# Patient Record
Sex: Male | Born: 1970 | Race: White | Hispanic: No | Marital: Single | Smoking: Never smoker
Health system: Southern US, Community
[De-identification: ages and names within clinical notes are randomized; demographics above are authoritative.]

## PROBLEM LIST (undated history)

## (undated) DIAGNOSIS — N200 Calculus of kidney: Secondary | ICD-10-CM

---

## 2007-07-22 ENCOUNTER — Emergency Department: Payer: Self-pay

## 2007-07-29 ENCOUNTER — Ambulatory Visit: Payer: Self-pay | Admitting: Urology

## 2009-09-04 ENCOUNTER — Ambulatory Visit: Payer: Self-pay | Admitting: Family Medicine

## 2010-03-14 ENCOUNTER — Ambulatory Visit: Payer: Self-pay | Admitting: Internal Medicine

## 2012-02-02 ENCOUNTER — Ambulatory Visit: Payer: Self-pay | Admitting: Internal Medicine

## 2015-10-02 ENCOUNTER — Ambulatory Visit: Payer: Self-pay

## 2015-10-02 ENCOUNTER — Ambulatory Visit (INDEPENDENT_AMBULATORY_CARE_PROVIDER_SITE_OTHER): Payer: 59

## 2015-10-02 ENCOUNTER — Ambulatory Visit
Admission: EM | Admit: 2015-10-02 | Discharge: 2015-10-02 | Disposition: A | Discharge status: Home / Self Care | Payer: 59 | Attending: Internal Medicine | Admitting: Internal Medicine

## 2015-10-02 ENCOUNTER — Encounter: Payer: Self-pay | Admitting: Gynecology

## 2015-10-02 DIAGNOSIS — N200 Calculus of kidney: Secondary | ICD-10-CM | POA: Diagnosis not present

## 2015-10-02 DIAGNOSIS — N1330 Unspecified hydronephrosis: Secondary | ICD-10-CM | POA: Diagnosis not present

## 2015-10-02 DIAGNOSIS — R1012 Left upper quadrant pain: Secondary | ICD-10-CM | POA: Diagnosis not present

## 2015-10-02 DIAGNOSIS — R109 Unspecified abdominal pain: Secondary | ICD-10-CM

## 2015-10-02 HISTORY — DX: Calculus of kidney: N20.0

## 2015-10-02 LAB — URINALYSIS COMPLETE WITH MICROSCOPIC (ARMC ONLY)
Bacteria, UA: NONE SEEN — AB
Glucose, UA: NEGATIVE mg/dL
LEUKOCYTES UA: NEGATIVE
NITRITE: NEGATIVE
PROTEIN: NEGATIVE mg/dL
SPECIFIC GRAVITY, URINE: 1.015 (ref 1.005–1.030)
Squamous Epithelial / LPF: NONE SEEN — AB
pH: 6.5 (ref 5.0–8.0)

## 2015-10-02 MED ORDER — ONDANSETRON HCL 4 MG PO TABS: 8.0000 mg | ORAL_TABLET | ORAL | Status: DC | PRN

## 2015-10-02 MED ORDER — KETOROLAC TROMETHAMINE 10 MG PO TABS: 10.0000 mg | ORAL_TABLET | Freq: Four times a day (QID) | ORAL | Status: DC | PRN

## 2015-10-02 MED ORDER — KETOROLAC TROMETHAMINE 60 MG/2ML IM SOLN
60.0000 mg | Freq: Once | INTRAMUSCULAR | Status: AC
  Administered 2015-10-02: 60 mg via INTRAMUSCULAR

## 2015-10-02 MED ORDER — OXYCODONE-ACETAMINOPHEN 5-325 MG PO TABS: 1.0000 | ORAL_TABLET | Freq: Four times a day (QID) | ORAL | Status: DC | PRN

## 2015-10-02 MED ORDER — SODIUM CHLORIDE 0.9 % IV BOLUS (SEPSIS)
1000.0000 mL | Freq: Once | INTRAVENOUS | Status: AC
Start: 2015-10-02 — End: 2015-10-02
  Administered 2015-10-02: 1000 mL via INTRAVENOUS

## 2015-10-02 NOTE — ED Notes (Signed)
Patient c/o lower abdomen Pain and nausea x 3 days. Per patient pain is similar to when he had his first kidney stone which he had passed..Marland Kitchen

## 2015-10-02 NOTE — ED Provider Notes (Signed)
CSN: [id number]     Arrival date & time 10/02/15  4098 History   First MD Initiated Contact with Patient 10/02/15 1119     Chief Complaint  Patient presents with  . Abdominal Pain   HPI  Patient is a 44 year old with past history of kidney stone, had one about 4 years ago the past spontaneously after about 24 hours. He presents today with onset of severe left flank pain on November 4, with vomiting and sweats at onset. Persistent nausea since, limiting fluid intake. Not able to eat anything without vomiting. Diarrhea, has not had a bowel movement. Does not think he has had a fever. Denies other past medical history, denies prior surgery.  Family history notable for diabetes, heart disease  Past Medical History  Diagnosis Date  . Kidney calculi      Social History  Substance Use Topics  . Smoking status: Never Smoker   . Smokeless tobacco: None  . Alcohol Use: No    Review of Systems  All other systems reviewed and are negative.   Allergies  Review of patient's allergies indicates no known allergies.  Home Medications   Prior to Admission medications   Medication Sig Start Date End Date Taking? Authorizing Provider  oxycodone (OXY-IR) 5 MG capsule Take 5 mg by mouth every 4 (four) hours as needed (old pill that he was previous for his kidney stones.).   Yes Historical Provider, MD                        Meds Ordered and Administered this Visit   Medications  sodium chloride 0.9 % bolus 1,000 mL (1,000 mLs Intravenous Given 10/02/15 1152) x 2 liters  ketorolac (TORADOL) injection 60 mg (60 mg Intramuscular Given 10/02/15 1204)    BP 141/84 mmHg  Pulse 86  Temp(Src) 98.3 F (36.8 C) (Tympanic)  Resp 18  Ht  (1.753 m)  Wt 285 lb (129.275 kg)  BMI 42.07 kg/m2  SpO2 99%   Physical Exam  Constitutional: He is oriented to person, place, and time.  Alert, nicely groomed Sitting in chair, holding very still because movement makes the pain worse  HENT:  Head:  Atraumatic.  Eyes:  Conjugate gaze, no eye redness/drainage  Neck: Neck supple.  Cardiovascular: Normal rate and regular rhythm.   Pulmonary/Chest: No respiratory distress. He has no wheezes. He has no rales.  Lungs clear, symmetric breath sounds  Abdominal:  Difficult abdominal exam, abdomen is large/protuberant, possibly somewhat distended. There is what appears to be voluntary type guarding. There is diffuse tenderness, with increasing severity of tenderness into the left lower quadrant. No frank rebound. Positive left CVAT  Musculoskeletal: Normal range of motion.  Neurological: He is alert and oriented to person, place, and time.  Skin: Skin is warm and dry.  No cyanosis  Nursing note and vitals reviewed.   ED Course  Procedures (including critical care time)  Labs Review  Results for orders placed or performed during the hospital encounter of 10/02/15  Urinalysis complete, with microscopic  Result Value Ref Range   Color, Urine YELLOW YELLOW   APPearance CLEAR CLEAR   Glucose, UA NEGATIVE NEGATIVE mg/dL   Bilirubin Urine 1+ (A) NEGATIVE   Ketones, ur 2+ (A) NEGATIVE mg/dL   Specific Gravity, Urine 1.015 1.005 - 1.030   Hgb urine dipstick TRACE (A) NEGATIVE   pH 6.5 5.0 - 8.0   Protein, ur NEGATIVE NEGATIVE mg/dL   Nitrite NEGATIVE NEGATIVE  Leukocytes, UA NEGATIVE NEGATIVE   RBC / HPF 0-5 <3 RBC/hpf   WBC, UA 0-5 <3 WBC/hpf   Bacteria, UA NONE SEEN (A) RARE   Squamous Epithelial / LPF NONE SEEN (A) RARE     Imaging Review Ct Renal Stone Study  10/02/2015  CLINICAL DATA:  History of kidney stones. Now with left upper quadrant and left lower quadrant pain. EXAM: CT ABDOMEN AND PELVIS WITHOUT CONTRAST TECHNIQUE: Multidetector CT imaging of the abdomen and pelvis was performed following the standard protocol without IV contrast. COMPARISON:  07/22/2007 FINDINGS: Lower chest:  No pleural effusion.  The lung bases are clear. Hepatobiliary: There are several cysts  identified within the liver. The gallbladder is normal. No biliary dilatation. Pancreas: Negative. Spleen: Normal appearance of the spleen. Adrenals/Urinary Tract: The adrenal glands are both normal. Multiple nonobstructing right renal calculi identified. The largest is in the upper pole of the right kidney measuring 4 mm, image 39 of series 6. Left-sided hydronephrosis and hydroureter is identified. At the left UPJ there is a 6 mm stone, image 50 of series 6. The urinary bladder appears normal. Stomach/Bowel: The stomach is within normal limits. The small bowel loops have a normal course and caliber. No obstruction. Normal appearance of the colon. Vascular/Lymphatic: Normal appearance of the abdominal aorta. No enlarged retroperitoneal or mesenteric adenopathy. No enlarged pelvic or inguinal lymph nodes. Reproductive: The prostate gland and seminal vesicles are unremarkable. Other: There is no ascites or focal fluid collections within the abdomen or pelvis. Musculoskeletal: No aggressive lytic or sclerotic bone lesions. IMPRESSION: 1. Left-sided hydronephrosis secondary to 6 mm UPJ calculus. 2. Bilateral nephrolithiasis. 3. Liver cysts. Electronically Signed   By: Signa Kellaylor  Stroud M.D.   On: 10/02/2015 12:53     MDM   1. Left nephrolithiasis   2. Acute left flank pain   3. Hydronephrosis of left kidney    followup with urology; call for appointment.  Discharge Medication List as of 10/02/2015  2:42 PM    START taking these medications   Details  ketorolac (TORADOL) 10 MG tablet Take 1 tablet (10 mg total) by mouth every 6 (six) hours as needed., Starting 10/02/2015, Until Discontinued, Normal    ondansetron (ZOFRAN) 4 MG tablet Take 2 tablets (8 mg total) by mouth every 4 (four) hours as needed for nausea or vomiting., Starting 10/02/2015, Until Discontinued, Normal    oxyCODONE-acetaminophen (PERCOCET/ROXICET) 5-325 MG tablet Take 1 tablet by mouth every 6 (six) hours as needed for severe pain.,  Starting 10/02/2015, Until Discontinued, Print           Eustace MooreLaura W Shaquandra Galano, MD 10/02/15 [phone]

## 2015-10-02 NOTE — Discharge Instructions (Signed)
Make appointment for followup as soon as possible with urologist (office number printed above), for left sided kidney stone causing swelling of the left kidney. Prescriptions for toradol (ketorolac, anti inflammatory and pain medicine) and zofran (ondansetron, for nausea) were sent to the pharmacy; Prescription for #15 percocet was printed. Injection of toradol 60mg  IM was given at the urgent care for pain; 2 liters of normal saline iv fluid were given for evidence of mild dehydration due to vomiting/decreased fluid intake.

## 2015-10-10 NOTE — H&P (Signed)
NAMGwenyth Thompson:  Schall, Niccolo          ACCOUNT NO.:  [id number]  MEDICAL RECORD NO.:  [id number]  LOCATION:  [facility]                        FACILITY:  [facility]  PHYSICIAN:  Anola GurneyMichael Andjela Wickes          DATE OF BIRTH:  [birth year]  DATE OF ADMISSION:  10/10/2015 DATE OF DISCHARGE:                            HISTORY AND PHYSICAL   SAME-DAY SURGERY:  October 11, 2015.  CHIEF COMPLAINT:  Left flank pain.  HISTORY OF PRESENT ILLNESS:  Mr. Dorothey BasemanStrickland is a 44 year old white male with sudden onset of left flank pain associated with nausea and vomiting on September 30, 2015.  The patient went to the emergency room on October 02, 2015, and had a CT scan done which revealed a 6 mm UPJ stone.  The stone has failed to progress and he has had persistent and severe pain. The patient comes in now for cystoscopy with left retrograde and stent placement.  PAST MEDICAL HISTORY:  No drug allergies.  CURRENT MEDICATIONS:  Oxycodone, Nucynta, Zofran.  PAST SURGICAL HISTORY:  No previous surgical procedures.  SOCIAL HISTORY:  The patient denied tobacco or alcohol use.  FAMILY HISTORY:  Remarkable for heart disease, diabetes, hypertension.  PAST AND CURRENT MEDICAL CONDITIONS:  Negative.  REVIEW OF SYSTEMS:  The patient denied chest pain, shortness of breath, diabetes, stroke, or hypertension.  PHYSICAL EXAMINATION:  GENERAL:  Well-nourished white male, in no acute distress. HEENT:  Sclerae were clear.  Pupils were equally round, reactive to light and accommodation.  Extraocular movements were intact. NECK:  Supple.  No palpable cervical adenopathy. LUNGS:  Clear to auscultation. CARDIOVASCULAR:  Regular rhythm and rate without audible murmurs. ABDOMEN:  Soft, nontender abdomen. GU AND RECTAL:  Deferred. NEUROMUSCULAR:  Alert and orient x3.  IMPRESSION:  Left ureteropelvic junction stone with renal colic.  PLAN:  Cystoscopy with left retrograde and stent placement.     ______________________________ Anola GurneyMichael Sandy Blouch     MW/MEDQ  D:  10/10/2015  T:  10/10/2015  Job:  [id number]

## 2015-10-11 ENCOUNTER — Encounter: Admission: RE | Payer: Self-pay | Source: Ambulatory Visit

## 2015-10-11 ENCOUNTER — Ambulatory Visit: Admission: RE | Admit: 2015-10-11 | Payer: Self-pay | Source: Ambulatory Visit | Admitting: Urology

## 2015-10-11 SURGERY — CYSTOSCOPY, WITH RETROGRADE PYELOGRAM
Anesthesia: Choice | Laterality: Left

## 2015-10-12 ENCOUNTER — Encounter: Payer: Self-pay | Admitting: *Deleted

## 2015-10-13 ENCOUNTER — Encounter: Payer: Self-pay | Admitting: *Deleted

## 2015-10-13 ENCOUNTER — Ambulatory Visit
Admission: RE | Admit: 2015-10-13 | Discharge: 2015-10-13 | Disposition: A | Discharge status: Home / Self Care | Payer: 59 | Source: Ambulatory Visit | Attending: Urology | Admitting: Urology

## 2015-10-13 ENCOUNTER — Encounter
Admission: RE | Disposition: A | Discharge status: Home / Self Care | Payer: Self-pay | Source: Ambulatory Visit | Attending: Urology

## 2015-10-13 DIAGNOSIS — N200 Calculus of kidney: Secondary | ICD-10-CM | POA: Insufficient documentation

## 2015-10-13 DIAGNOSIS — Z8249 Family history of ischemic heart disease and other diseases of the circulatory system: Secondary | ICD-10-CM | POA: Insufficient documentation

## 2015-10-13 DIAGNOSIS — Z833 Family history of diabetes mellitus: Secondary | ICD-10-CM | POA: Insufficient documentation

## 2015-10-13 DIAGNOSIS — Z79899 Other long term (current) drug therapy: Secondary | ICD-10-CM | POA: Diagnosis not present

## 2015-10-13 HISTORY — PX: EXTRACORPOREAL SHOCK WAVE LITHOTRIPSY: SHX1557

## 2015-10-13 SURGERY — LITHOTRIPSY, ESWL
Anesthesia: Moderate Sedation | Laterality: Left

## 2015-10-13 MED ORDER — MIDAZOLAM HCL 2 MG/2ML IJ SOLN
INTRAMUSCULAR | Status: AC
  Administered 2015-10-13: 1 mg via INTRAMUSCULAR
  Filled 2015-10-13: qty 2

## 2015-10-13 MED ORDER — DIPHENHYDRAMINE HCL 25 MG PO CAPS
25.0000 mg | ORAL_CAPSULE | ORAL | Status: AC
  Administered 2015-10-13: 25 mg via ORAL

## 2015-10-13 MED ORDER — DIPHENHYDRAMINE HCL 25 MG PO CAPS
ORAL_CAPSULE | ORAL | Status: AC
  Administered 2015-10-13: 25 mg via ORAL
  Filled 2015-10-13: qty 1

## 2015-10-13 MED ORDER — MIDAZOLAM HCL 2 MG/2ML IJ SOLN
1.0000 mg | Freq: Once | INTRAMUSCULAR | Status: AC
  Administered 2015-10-13: 1 mg via INTRAMUSCULAR

## 2015-10-13 MED ORDER — NUCYNTA 50 MG PO TABS
50.0000 mg | ORAL_TABLET | Freq: Four times a day (QID) | ORAL | Status: DC | PRN
Start: 2015-10-13 — End: 2017-02-19

## 2015-10-13 MED ORDER — LIDOCAINE HCL (PF) 1 % IJ SOLN
INTRAMUSCULAR | Status: AC
  Filled 2015-10-13: qty 2

## 2015-10-13 MED ORDER — LEVOFLOXACIN 500 MG PO TABS
ORAL_TABLET | ORAL | Status: AC
  Administered 2015-10-13: 500 mg via ORAL
  Filled 2015-10-13: qty 1

## 2015-10-13 MED ORDER — TAPENTADOL HCL 50 MG PO TABS
50.0000 mg | ORAL_TABLET | Freq: Once | ORAL | Status: AC
  Administered 2015-10-13: 50 mg via ORAL
  Filled 2015-10-13: qty 1

## 2015-10-13 MED ORDER — FUROSEMIDE 10 MG/ML IJ SOLN
10.0000 mg | Freq: Once | INTRAMUSCULAR | Status: AC
  Administered 2015-10-13: 10 mg via INTRAVENOUS

## 2015-10-13 MED ORDER — MORPHINE SULFATE (PF) 10 MG/ML IV SOLN
INTRAVENOUS | Status: AC
  Administered 2015-10-13: 10 mg via INTRAMUSCULAR
  Filled 2015-10-13: qty 1

## 2015-10-13 MED ORDER — DEXTROSE-NACL 5-0.45 % IV SOLN
INTRAVENOUS | Status: DC
  Administered 2015-10-13: 07:00:00 via INTRAVENOUS

## 2015-10-13 MED ORDER — PROMETHAZINE HCL 25 MG/ML IJ SOLN
INTRAMUSCULAR | Status: AC
  Administered 2015-10-13: 25 mg via INTRAMUSCULAR
  Filled 2015-10-13: qty 1

## 2015-10-13 MED ORDER — MORPHINE SULFATE (PF) 10 MG/ML IV SOLN
10.0000 mg | Freq: Once | INTRAVENOUS | Status: AC
  Administered 2015-10-13: 10 mg via INTRAMUSCULAR

## 2015-10-13 MED ORDER — FUROSEMIDE 10 MG/ML IJ SOLN
INTRAMUSCULAR | Status: AC
  Filled 2015-10-13: qty 2

## 2015-10-13 MED ORDER — ONDANSETRON 8 MG PO TBDP: 8.0000 mg | ORAL_TABLET | Freq: Four times a day (QID) | ORAL | Status: DC | PRN

## 2015-10-13 MED ORDER — TAMSULOSIN HCL 0.4 MG PO CAPS: 0.4000 mg | ORAL_CAPSULE | Freq: Every day | ORAL | Status: DC

## 2015-10-13 MED ORDER — DOCUSATE SODIUM 100 MG PO CAPS: 200.0000 mg | ORAL_CAPSULE | Freq: Two times a day (BID) | ORAL | Status: DC

## 2015-10-13 MED ORDER — PROMETHAZINE HCL 25 MG/ML IJ SOLN
25.0000 mg | Freq: Once | INTRAMUSCULAR | Status: AC
  Administered 2015-10-13: 25 mg via INTRAMUSCULAR

## 2015-10-13 MED ORDER — LEVOFLOXACIN 500 MG PO TABS
500.0000 mg | ORAL_TABLET | ORAL | Status: AC
  Administered 2015-10-13: 500 mg via ORAL

## 2015-10-13 MED ORDER — PROMETHAZINE HCL 25 MG/ML IJ SOLN: 25.0000 mg | Freq: Once | INTRAMUSCULAR | Status: DC

## 2015-10-13 NOTE — Discharge Instructions (Addendum)
Dietary Guidelines to Help Prevent Kidney Stones °Your risk of kidney stones can be decreased by adjusting the foods you eat. The most important thing you can do is drink enough fluid. You should drink enough fluid to keep your urine clear or pale yellow. The following guidelines provide specific information for the type of kidney stone you have had. °GUIDELINES ACCORDING TO TYPE OF KIDNEY STONE °Calcium Oxalate Kidney Stones °· Reduce the amount of salt you eat. Foods that have a lot of salt cause your body to release excess calcium into your urine. The excess calcium can combine with a substance called oxalate to form kidney stones. °· Reduce the amount of animal protein you eat if the amount you eat is excessive. Animal protein causes your body to release excess calcium into your urine. Ask your dietitian how much protein from animal sources you should be eating. °· Avoid foods that are high in oxalates. If you take vitamins, they should have less than 500 mg of vitamin C. Your body turns vitamin C into oxalates. You do not need to avoid fruits and vegetables high in vitamin C. °Calcium Phosphate Kidney Stones °· Reduce the amount of salt you eat to help prevent the release of excess calcium into your urine. °· Reduce the amount of animal protein you eat if the amount you eat is excessive. Animal protein causes your body to release excess calcium into your urine. Ask your dietitian how much protein from animal sources you should be eating. °· Get enough calcium from food or take a calcium supplement (ask your dietitian for recommendations). Food sources of calcium that do not increase your risk of kidney stones include: °· Broccoli. °· Dairy products, such as cheese and yogurt. °· Pudding. °Uric Acid Kidney Stones °· Do not have more than 6 oz of animal protein per day. °FOOD SOURCES °Animal Protein Sources °· Meat (all types). °· Poultry. °· Eggs. °· Fish, seafood. °Foods High in Salt °· Salt seasonings. °· Soy  sauce. °· Teriyaki sauce. °· Cured and processed meats. °· Salted crackers and snack foods. °· Fast food. °· Canned soups and most canned foods. °Foods High in Oxalates °· Grains: °· Amaranth. °· Barley. °· Grits. °· Wheat germ. °· Bran. °· Buckwheat flour. °· All bran cereals. °· Pretzels. °· Whole wheat bread. °· Vegetables: °· Beans (wax). °· Beets and beet greens. °· Collard greens. °· Eggplant. °· Escarole. °· Leeks. °· Okra. °· Parsley. °· Rutabagas. °· Spinach. °· Swiss chard. °· Tomato paste. °· Fried potatoes. °· Sweet potatoes. °· Fruits: °· Red currants. °· Figs. °· Kiwi. °· Rhubarb. °· Meat and Other Protein Sources: °· Beans (dried). °· Soy burgers and other soybean products. °· Miso. °· Nuts (peanuts, almonds, pecans, cashews, hazelnuts). °· Nut butters. °· Sesame seeds and tahini (paste made of sesame seeds). °· Poppy seeds. °· Beverages: °· Chocolate drink mixes. °· Soy milk. °· Instant iced tea. °· Juices made from high-oxalate fruits or vegetables. °· Other: °· Carob. °· Chocolate. °· Fruitcake. °· Marmalades. °  °This information is not intended to replace advice given to you by your health care provider. Make sure you discuss any questions you have with your health care provider. °  °Document Released: 03/09/2011 Document Revised: 11/17/2013 Document Reviewed: 10/09/2013 °Elsevier Interactive Patient Education ©2016 [employer] ° °Kidney Stones °Kidney stones (urolithiasis) are deposits that form inside your kidneys. The intense pain is caused by the stone moving through the urinary tract. When the stone moves, the ureter   goes into spasm around the stone. The stone is usually passed in the urine.  °CAUSES  °· A disorder that makes certain neck glands produce too much parathyroid hormone (primary hyperparathyroidism). °· A buildup of uric acid crystals, similar to gout in your joints. °· Narrowing (stricture) of the ureter. °· A kidney obstruction present at birth (congenital  obstruction). °· Previous surgery on the kidney or ureters. °· Numerous kidney infections. °SYMPTOMS  °· Feeling sick to your stomach (nauseous). °· Throwing up (vomiting). °· Blood in the urine (hematuria). °· Pain that usually spreads (radiates) to the groin. °· Frequency or urgency of urination. °DIAGNOSIS  °· Taking a history and physical exam. °· Blood or urine tests. °· CT scan. °· Occasionally, an examination of the inside of the urinary bladder (cystoscopy) is performed. °TREATMENT  °· Observation. °· Increasing your fluid intake. °· Extracorporeal shock wave lithotripsy--This is a noninvasive procedure that uses shock waves to break up kidney stones. °· Surgery may be needed if you have severe pain or persistent obstruction. There are various surgical procedures. Most of the procedures are performed with the use of small instruments. Only small incisions are needed to accommodate these instruments, so recovery time is minimized. °The size, location, and chemical composition are all important variables that will determine the proper choice of action for you. Talk to your health care provider to better understand your situation so that you will minimize the risk of injury to yourself and your kidney.  °HOME CARE INSTRUCTIONS  °· Drink enough water and fluids to keep your urine clear or pale yellow. This will help you to pass the stone or stone fragments. °· Strain all urine through the provided strainer. Keep all particulate matter and stones for your health care provider to see. The stone causing the pain may be as small as a grain of salt. It is very important to use the strainer each and every time you pass your urine. The collection of your stone will allow your health care provider to analyze it and verify that a stone has actually passed. The stone analysis will often identify what you can do to reduce the incidence of recurrences. °· Only take over-the-counter or prescription medicines for pain,  discomfort, or fever as directed by your health care provider. °· Keep all follow-up visits as told by your health care provider. This is important. °· Get follow-up X-rays if required. The absence of pain does not always mean that the stone has passed. It may have only stopped moving. If the urine remains completely obstructed, it can cause loss of kidney function or even complete destruction of the kidney. It is your responsibility to make sure X-rays and follow-ups are completed. Ultrasounds of the kidney can show blockages and the status of the kidney. Ultrasounds are not associated with any radiation and can be performed easily in a matter of minutes. °· Make changes to your daily diet as told by your health care provider. You may be told to: °· Limit the amount of salt that you eat. °· Eat 5 or more servings of fruits and vegetables each day. °· Limit the amount of meat, poultry, fish, and eggs that you eat. °· Collect a 24-hour urine sample as told by your health care provider. You may need to collect another urine sample every 6-12 months. °SEEK MEDICAL CARE IF: °· You experience pain that is progressive and unresponsive to any pain medicine you have been prescribed. °SEEK IMMEDIATE MEDICAL CARE IF:  °· Pain   cannot be controlled with the prescribed medicine. °· You have a fever or shaking chills. °· The severity or intensity of pain increases over 18 hours and is not relieved by pain medicine. °· You develop a new onset of abdominal pain. °· You feel faint or pass out. °· You are unable to urinate. °  °This information is not intended to replace advice given to you by your health care provider. Make sure you discuss any questions you have with your health care provider. °  °Document Released: 11/12/2005 Document Revised: 08/03/2015 Document Reviewed: 04/15/2013 °Elsevier Interactive Patient Education ©2016 [employer] ° °Lithotripsy, Care After °Refer to this sheet in the next few weeks. These instructions  provide you with information on caring for yourself after your procedure. Your health care provider may also give you more specific instructions. Your treatment has been planned according to current medical practices, but problems sometimes occur. Call your health care provider if you have any problems or questions after your procedure. °WHAT TO EXPECT AFTER THE PROCEDURE  °· Your urine may have a red tinge for a few days after treatment. Blood loss is usually minimal. °· You may have soreness in the back or flank area. This usually goes away after a few days. The procedure can cause blotches or bruises on the back where the pressure wave enters the skin. These marks usually cause only minimal discomfort and should disappear in a short time. °· Stone fragments should begin to pass within 24 hours of treatment. However, a delayed passage is not unusual. °· You may have pain, discomfort, and feel sick to your stomach (nauseated) when the crushed fragments of stone are passed down the tube from the kidney to the bladder. Stone fragments can pass soon after the procedure and may last for up to 4-8 weeks. °· A small number of patients may have severe pain when stone fragments are not able to pass, which leads to an obstruction. °· If your stone is greater than 1 inch (2.5 cm) in diameter or if you have multiple stones that have a combined diameter greater than 1 inch (2.5 cm), you may require more than one treatment. °· If you had a stent placed prior to your procedure, you may experience some discomfort, especially during urination. You may experience the pain or discomfort in your flank or back, or you may experience a sharp pain or discomfort at the base of your penis or in your lower abdomen. The discomfort usually lasts only a few minutes after urinating. °HOME CARE INSTRUCTIONS  °· Rest at home until you feel your energy improving. °· Only take over-the-counter or prescription medicines for pain, discomfort, or  fever as directed by your health care provider. Depending on the type of lithotripsy, you may need to take antibiotics and anti-inflammatory medicines for a few days. °· Drink enough water and fluids to keep your urine clear or pale yellow. This helps "flush" your kidneys. It helps pass any remaining pieces of stone and prevents stones from coming back. °· Most people can resume daily activities within 1-2 days after standard lithotripsy. It can take longer to recover from laser and percutaneous lithotripsy. °· Strain all urine through the provided strainer. Keep all particulate matter and stones for your health care provider to see. The stone may be as small as a grain of salt. It is very important to use the strainer each and every time you pass your urine. Any stones that are found can be sent to   a medical lab for examination. °· Visit your health care provider for a follow-up appointment in a few weeks. Your doctor may remove your stent if you have one. Your health care provider will also check to see whether stone particles still remain. °SEEK MEDICAL CARE IF:  °· Your pain is not relieved by medicine. °· You have a lasting nauseous feeling. °· You feel there is too much blood in the urine. °· You develop persistent problems with frequent or painful urination that does not at least partially improve after 2 days following the procedure. °· You have a congested cough. °· You feel lightheaded. °· You develop a rash or any other signs that might suggest an allergic problem. °· You develop any reaction or side effects to your medicine(s). °SEEK IMMEDIATE MEDICAL CARE IF:  °· You experience severe back or flank pain or both. °· You see nothing but blood when you urinate. °· You cannot pass any urine at all. °· You have a fever or shaking chills. °· You develop shortness of breath, difficulty breathing, or chest pain. °· You develop vomiting that will not stop after 6-8 hours. °· You have a fainting episode. °  °This  information is not intended to replace advice given to you by your health care provider. Make sure you discuss any questions you have with your health care provider. °  °Document Released: 12/02/2007 Document Revised: 08/03/2015 Document Reviewed: 05/28/2013 °Elsevier Interactive Patient Education ©2016 [employer] ° °Lithotripsy °Lithotripsy is a treatment that can sometimes help eliminate kidney stones and pain that they cause. A form of lithotripsy, also known as extracorporeal shock wave lithotripsy, is a nonsurgical procedure that helps your body rid itself of the kidney stone when it is too big to pass on its own. Extracorporeal shock wave lithotripsy is a method of crushing a kidney stone with shock waves. These shock waves pass through your body and are focused on your stone. They cause the kidney stones to crumble while still in the urinary tract. It is then easier for the smaller pieces of stone to pass in the urine. °Lithotripsy usually takes about an hour. It is done in a hospital, a lithotripsy center, or a mobile unit. It usually does not require an overnight stay. Your health care provider will instruct you on preparation for the procedure. Your health care provider will tell you what to expect afterward. °LET YOUR HEALTH CARE PROVIDER KNOW ABOUT: °· Any allergies you have. °· All medicines you are taking, including vitamins, herbs, eye drops, creams, and over-the-counter medicines. °· Previous problems you or members of your family have had with the use of anesthetics. °· Any blood disorders you have. °· Previous surgeries you have had. °· Medical conditions you have. °RISKS AND COMPLICATIONS °Generally, lithotripsy for kidney stones is a safe procedure. However, as with any procedure, complications can occur. Possible complications include: °· Infection. °· Bleeding of the kidney. °· Bruising of the kidney or skin. °· Obstruction of the ureter. °· Failure of the stone to fragment. °BEFORE THE  PROCEDURE °· Do not eat or drink for 6-8 hours prior to the procedure. You may, however, take the medications with a sip of water that your physician instructs you to take °· Do not take aspirin or aspirin-containing products for 7 days prior to your procedure °· Do not take nonsteroidal anti-inflammatory products for 7 days prior to your procedure °PROCEDURE °A stent (flexible tube with holes) may be placed in your ureter. The ureter is   the tube that transports the urine from the kidneys to the bladder. Your health care provider may place a stent before the procedure. This will help keep urine flowing from the kidney if the fragments of the stone block the ureter. You may have an IV tube placed in one of your veins to give you fluids and medicines. These medicines may help you relax or make you sleep. During the procedure, you will lie comfortably on a fluid-filled cushion or in a warm-water bath. After an X-ray or ultrasound exam to locate your stone, shock waves are aimed at the stone. If you are awake, you may feel a tapping sensation as the shock waves pass through your body. If large stone particles remain after treatment, a second procedure may be necessary at a later date. °For comfort during the test: °· Relax as much as possible. °· Try to remain still as much as possible. °· Try to follow instructions to speed up the test. °· Let your health care provider know if you are uncomfortable, anxious, or in pain. °AFTER THE PROCEDURE  °After surgery, you will be taken to the recovery area. A nurse will watch and check your progress. Once you're awake, stable, and taking fluids well, you will be allowed to go home as long as there are no problems. You will also be allowed to pass your urine before discharge. You may be given antibiotics to help prevent infection. You may also be prescribed pain medicine if needed. In a week or two, your health care provider may remove your stent, if you have one. You may first  have an X-ray exam to check on how successful the fragmentation of your stone has been and how much of the stone has passed. Your health care provider will check to see whether or not stone particles remain. °SEEK IMMEDIATE MEDICAL CARE IF: °· You develop a fever or shaking chills. °· Your pain is not relieved by medicine. °· You feel sick to your stomach (nauseated) and you vomit. °· You develop heavy bleeding. °· You have difficulty urinating. °· You start to pass your stent from your penis. °  °This information is not intended to replace advice given to you by your health care provider. Make sure you discuss any questions you have with your health care provider. °  °Document Released: 11/09/2000 Document Revised: 12/03/2014 Document Reviewed: 05/28/2013 °[employer]vier Interactive Patient Education ©2016 [employer] ° °Renal Colic °Renal colic is pain that is caused by passing a kidney stone. The pain can be sharp and severe. It may be felt in the back, abdomen, side (flank), or groin. It can cause nausea. Renal colic can come and go. °HOME CARE INSTRUCTIONS °Watch your condition for any changes. The following actions may help to lessen any discomfort that you are feeling: °· Take medicines only as directed by your health care provider. °· Ask your health care provider if it is okay to take over-the-counter pain medicine. °· Drink enough fluid to keep your urine clear or pale yellow. Drink 6-8 glasses of water each day. °· Limit the amount of salt that you eat to less than 2 grams per day. °· Reduce the amount of protein in your diet. Eat less meat, fish, nuts, and dairy. °· Avoid foods such as spinach, rhubarb, nuts, or bran. These may make kidney stones more likely to form. °SEEK MEDICAL CARE IF: °· You have a fever or chills. °· Your urine smells bad or looks cloudy. °· You have pain or   burning when you pass urine. °SEEK IMMEDIATE MEDICAL CARE IF: °· Your flank pain or groin pain suddenly worsens. °· You become  confused or disoriented or you lose consciousness. °  °This information is not intended to replace advice given to you by your health care provider. Make sure you discuss any questions you have with your health care provider. °  °Document Released: 08/22/2005 Document Revised: 12/03/2014 Document Reviewed: 09/22/2014 °Elsevier Interactive Patient Education ©2016 [employer] ° °

## 2015-10-14 ENCOUNTER — Encounter: Payer: Self-pay | Admitting: Urology

## 2016-10-25 IMAGING — CT CT RENAL STONE PROTOCOL
2 of 6 series · 17 of 46 positions shown, 19 images · non-contrast
Comparison: 07/22/2007

CLINICAL DATA: History of kidney stones. Now with left upper
quadrant and left lower quadrant pain.

EXAM:
CT ABDOMEN AND PELVIS WITHOUT CONTRAST
TECHNIQUE: Multidetector CT imaging of the abdomen and pelvis was performed
following the standard protocol without IV contrast.

[Series 7: thins · axial · 0.78mm/px · z∈[-932,-466]mm · 14 of 426 slices shown, 16 images]
[im 19/426  soft-tissue]
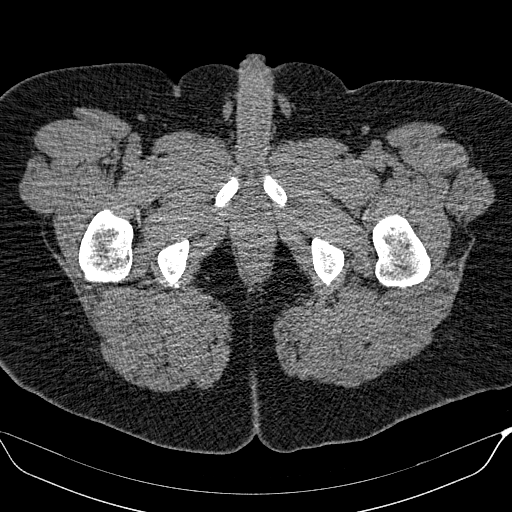
[im 19/426  bone]
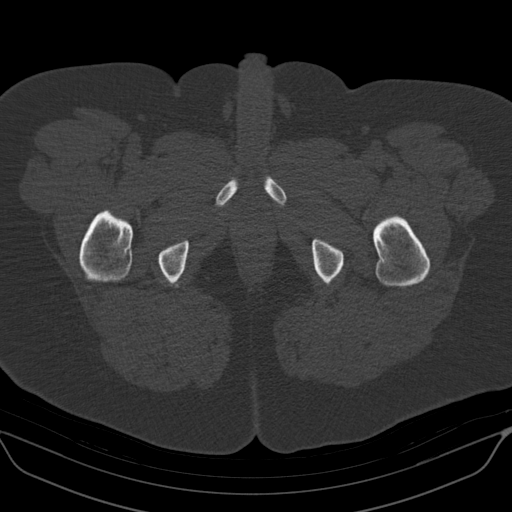
[im 56/426  soft-tissue]
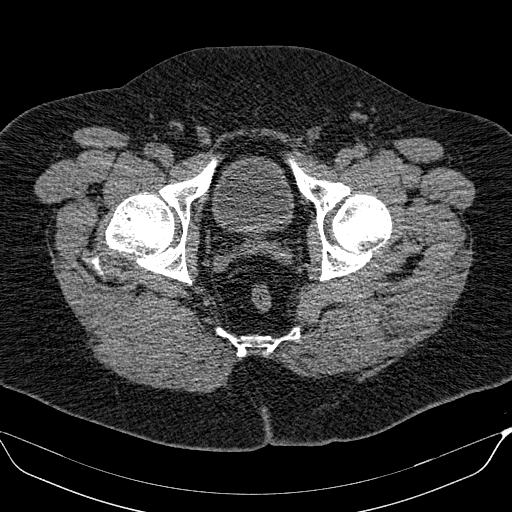
[im 74/426  soft-tissue]
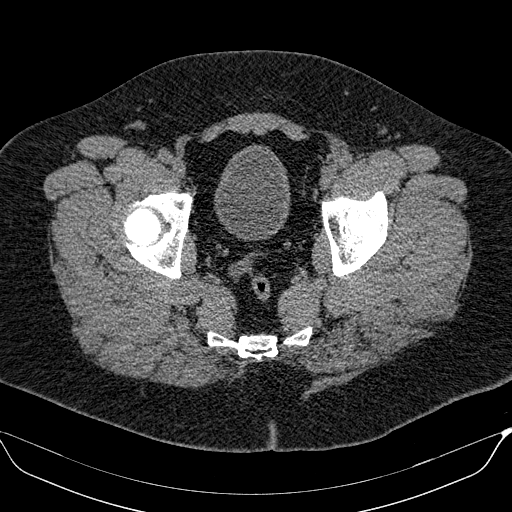
[im 111/426  soft-tissue]
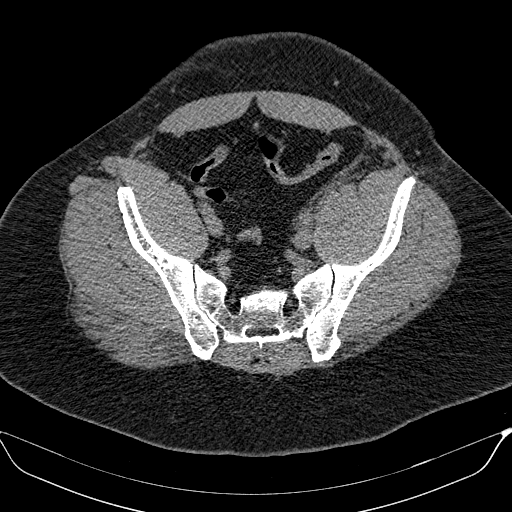
[im 148/426  soft-tissue]
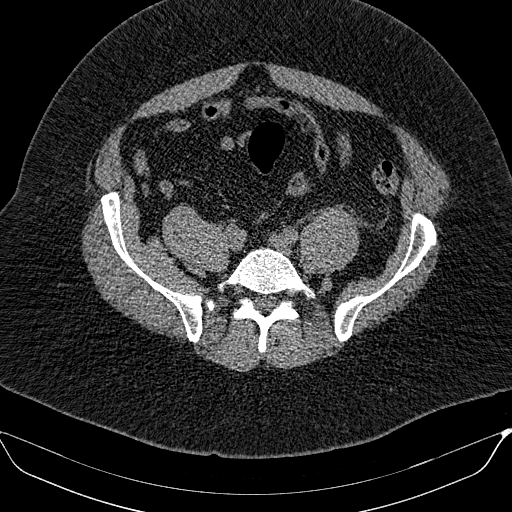
[im 167/426  soft-tissue]
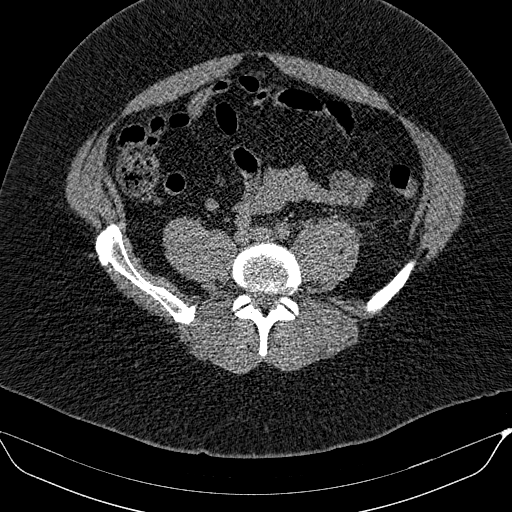
[im 204/426  soft-tissue]
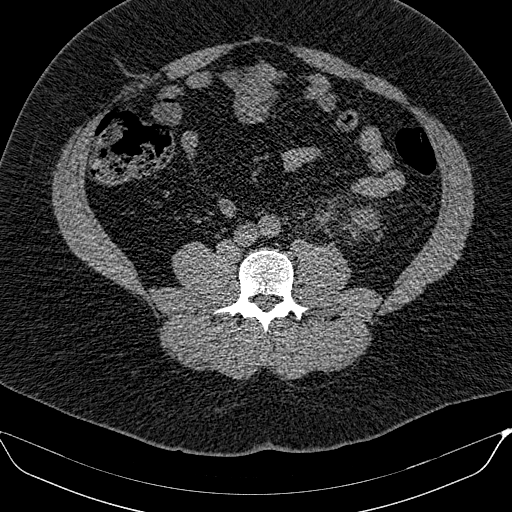
[im 222/426  soft-tissue]
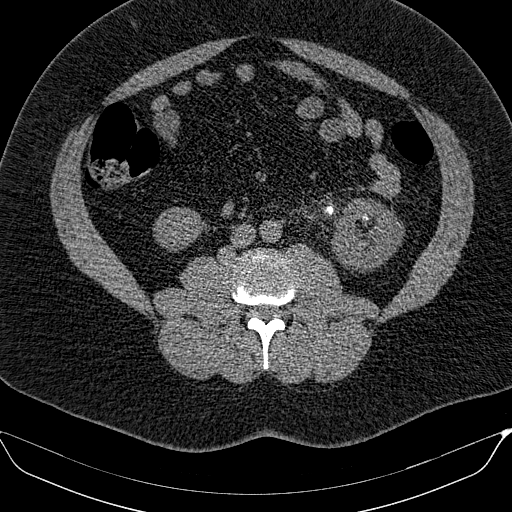
[im 259/426  soft-tissue]
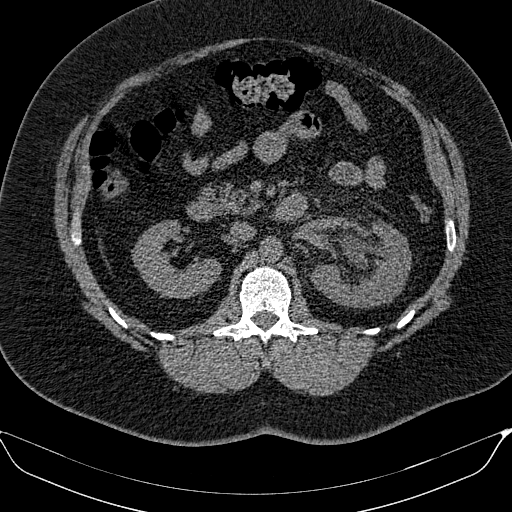
[im 259/426  bone]
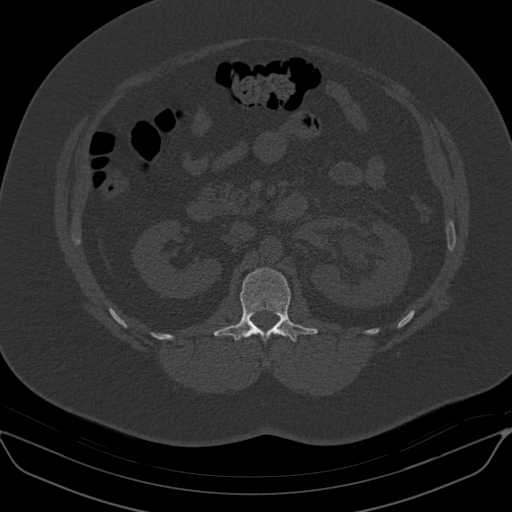
[im 278/426  soft-tissue]
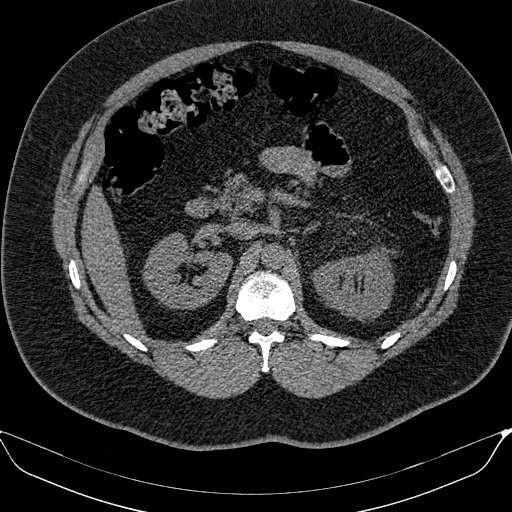
[im 315/426  soft-tissue]
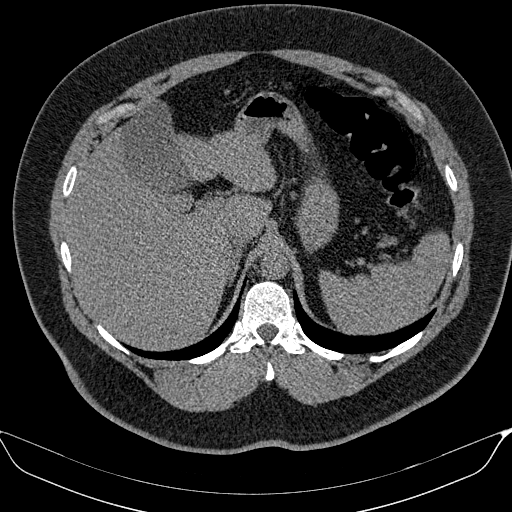
[im 352/426  soft-tissue]
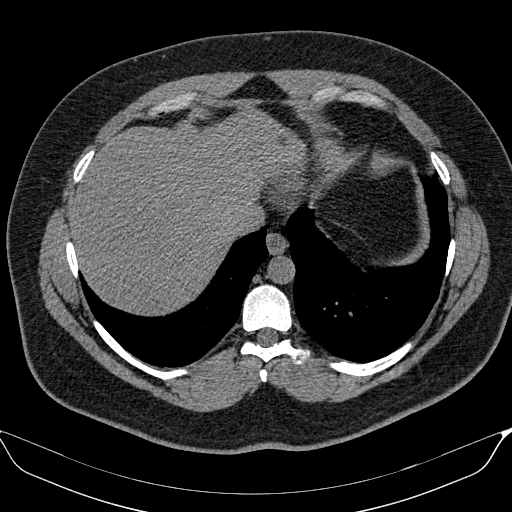
[im 370/426  soft-tissue]
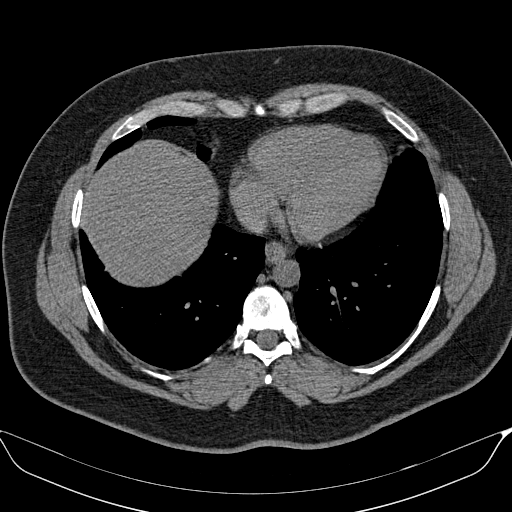
[im 407/426  soft-tissue]
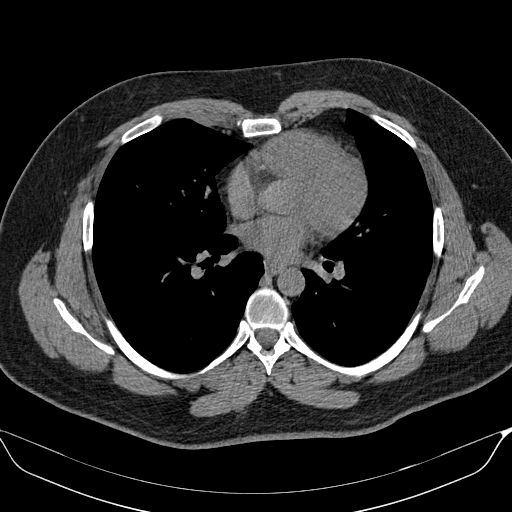

[Series 604: coronal · coronal · 1.00mm/px · 3 of 130 slices shown]
[im 44/130  soft-tissue]
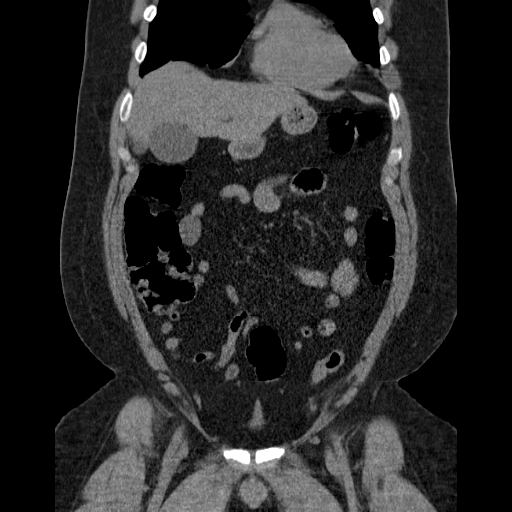
[im 58/130  soft-tissue]
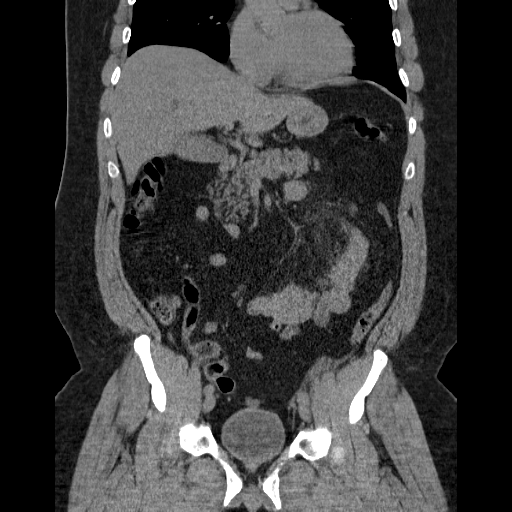
[im 72/130  soft-tissue]
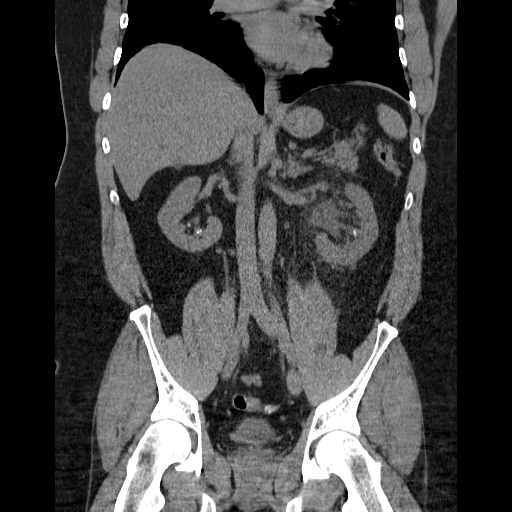

[17 of 46 positions shown; findings below may reference images not displayed]

FINDINGS: Lower chest:  No pleural effusion.  The lung bases are clear.

Hepatobiliary: There are several cysts identified within the liver.
The gallbladder is normal. No biliary dilatation.

Pancreas: Negative.

Spleen: Normal appearance of the spleen.

Adrenals/Urinary Tract: The adrenal glands are both normal. Multiple
nonobstructing right renal calculi identified. The largest is in the
upper pole of the right kidney measuring 4 mm, image 39 of series 6.
Left-sided hydronephrosis and hydroureter is identified. At the left
UPJ there is a 6 mm stone, image 50 of series 6. The urinary bladder
appears normal.

Stomach/Bowel: The stomach is within normal limits. The small bowel
loops have a normal course and caliber. No obstruction. Normal
appearance of the colon.

Vascular/Lymphatic: Normal appearance of the abdominal aorta. No
enlarged retroperitoneal or mesenteric adenopathy. No enlarged
pelvic or inguinal lymph nodes.

Reproductive: The prostate gland and seminal vesicles are
unremarkable.

Other: There is no ascites or focal fluid collections within the
abdomen or pelvis.

Musculoskeletal: No aggressive lytic or sclerotic bone lesions.
IMPRESSION: 1. Left-sided hydronephrosis secondary to 6 mm UPJ calculus.
2. Bilateral nephrolithiasis.
3. Liver cysts.

## 2017-02-19 ENCOUNTER — Ambulatory Visit
Admission: EM | Admit: 2017-02-19 | Discharge: 2017-02-19 | Disposition: A | Discharge status: Home / Self Care | Payer: 59 | Attending: Family Medicine | Admitting: Family Medicine

## 2017-02-19 DIAGNOSIS — R0602 Shortness of breath: Secondary | ICD-10-CM

## 2017-02-19 DIAGNOSIS — J209 Acute bronchitis, unspecified: Secondary | ICD-10-CM

## 2017-02-19 MED ORDER — IPRATROPIUM-ALBUTEROL 0.5-2.5 (3) MG/3ML IN SOLN
3.0000 mL | Freq: Once | RESPIRATORY_TRACT | Status: AC
  Administered 2017-02-19: 3 mL via RESPIRATORY_TRACT

## 2017-02-19 MED ORDER — ALBUTEROL SULFATE HFA 108 (90 BASE) MCG/ACT IN AERS: 2.0000 | INHALATION_SPRAY | RESPIRATORY_TRACT | 0 refills | Status: DC | PRN

## 2017-02-19 MED ORDER — PREDNISONE 10 MG (21) PO TBPK
ORAL_TABLET | ORAL | 0 refills | Status: DC
Start: 2017-02-19 — End: 2018-05-12

## 2017-02-19 MED ORDER — AZITHROMYCIN 500 MG PO TABS: 500.0000 mg | ORAL_TABLET | Freq: Every day | ORAL | 0 refills | Status: DC

## 2017-02-19 MED ORDER — HYDROCOD POLST-CPM POLST ER 10-8 MG/5ML PO SUER: 5.0000 mL | Freq: Two times a day (BID) | ORAL | 0 refills | Status: DC | PRN

## 2017-02-19 NOTE — ED Provider Notes (Signed)
[facility]    CSN: [id number] Arrival date & time: 02/19/17  4098     History   Chief Complaint Chief Complaint  Patient presents with  . URI    HPI Grant Thompson is a 46 y.o. male.   Patient's here because of shortness of breath and wheezing since he had a sinus infection about 2 weeks ago and has had this lower respiratory tract infection for about a week. He is coughing congested hoarse coughing up initially yellow-green sputum but last few days: Clear frothy him and his Cipro some bubbly. There's been blood in it both point was yellow-green when his been clear. He does not smoke and reports being hoarse. No known drug allergies. He's had a history of kidney stones before and had to have some strips he to remove the stones before the past. No pertinent family medical history relevant to today's visit.   The history is provided by the patient. No language interpreter was used.  URI  Presenting symptoms: congestion, cough and fatigue   Severity:  Moderate Onset quality:  Sudden Timing:  Constant Progression:  Waxing and waning Chronicity:  New Relieved by:  Nothing Worsened by:  Nothing Risk factors: recent illness     Past Medical History:  Diagnosis Date  . Kidney calculi     There are no active problems to display for this patient.   Past Surgical History:  Procedure Laterality Date  . EXTRACORPOREAL SHOCK WAVE LITHOTRIPSY Left 10/13/2015   Procedure: EXTRACORPOREAL SHOCK WAVE LITHOTRIPSY (ESWL);  Surgeon: Orson Ape, MD;  Location: [facility] ORS;  Service: Urology;  Laterality: Left;       Home Medications    Prior to Admission medications   Medication Sig Start Date End Date Taking? Authorizing Provider  albuterol (PROVENTIL HFA;VENTOLIN HFA) 108 (90 Base) MCG/ACT inhaler Inhale 2 puffs into the lungs every 4 (four) hours as needed for wheezing or shortness of breath. 02/19/17   Hassan Rowan, MD  azithromycin (ZITHROMAX) 500 MG tablet  Take 1 tablet (500 mg total) by mouth daily. 02/19/17   Hassan Rowan, MD  chlorpheniramine-HYDROcodone [facility] PENNKINETIC ER) 10-8 MG/5ML SUER Take 5 mLs by mouth every 12 (twelve) hours as needed. 02/19/17   Hassan Rowan, MD  predniSONE (STERAPRED UNI-PAK 21 TAB) 10 MG (21) TBPK tablet Sig 6 tablet day 1, 5 tablets day 2, 4 tablets day 3,,3tablets day 4, 2 tablets day 5, 1 tablet day 6 take all tablets orally 02/19/17   Hassan Rowan, MD    Family History History reviewed. No pertinent family history.  Social History Social History  Substance Use Topics  . Smoking status: Never Smoker  . Smokeless tobacco: Never Used  . Alcohol use No     Allergies   Patient has no known allergies.   Review of Systems Review of Systems  Constitutional: Positive for fatigue.  HENT: Positive for congestion.   Respiratory: Positive for cough.   All other systems reviewed and are negative.    Physical Exam Triage Vital Signs ED Triage Vitals  Enc Vitals Group     BP 02/19/17 1031 (!) 143/86     Pulse Rate 02/19/17 1031 79     Resp 02/19/17 1031 18     Temp 02/19/17 1031 98.3 F (36.8 C)     Temp Source 02/19/17 1031 Oral     SpO2 02/19/17 1031 99 %     Weight 02/19/17 1032 230 lb (104.3 kg)     Height 02/19/17  1032 (!) 6" (0.152 m)     Head Circumference --      Peak Flow --      Pain Score 02/19/17 1032 5     Pain Loc --      Pain Edu? --      Excl. in GC? --    No data found.   Updated Vital Signs BP (!) 143/86 (BP Location: Left Arm)   Pulse 86   Temp 98.3 F (36.8 C) (Oral)   Resp 18   Ht (!) 6" (0.152 m)   Wt 230 lb (104.3 kg)   SpO2 100%   BMI 4491.88 kg/m   Visual Acuity Right Eye Distance:   Left Eye Distance:   Bilateral Distance:    Right Eye Near:   Left Eye Near:    Bilateral Near:     Physical Exam  Constitutional: He appears well-developed and well-nourished.  HENT:  Head: Normocephalic and atraumatic.  Right Ear: Hearing, external ear and ear  canal normal.  Left Ear: Hearing, external ear and ear canal normal. Tympanic membrane is erythematous.  Nose: Mucosal edema present. Right sinus exhibits no maxillary sinus tenderness and no frontal sinus tenderness. Left sinus exhibits no maxillary sinus tenderness and no frontal sinus tenderness.  Mouth/Throat: Uvula is midline. Posterior oropharyngeal erythema present.  Eyes: Pupils are equal, round, and reactive to light. Right eye exhibits no discharge. Left eye exhibits no discharge.  Neck: Normal range of motion. Neck supple.  Cardiovascular: Normal rate, regular rhythm and normal heart sounds.   Pulmonary/Chest: Effort normal. He has wheezes.  Abdominal: Soft.  Musculoskeletal: Normal range of motion.  Lymphadenopathy:    He has cervical adenopathy.  Neurological: He is alert.  Skin: Skin is warm.  Psychiatric: He has a normal mood and affect.  Vitals reviewed.    UC Treatments / Results  Labs (all labs ordered are listed, but only abnormal results are displayed) Labs Reviewed - No data to display  EKG  EKG Interpretation None       Radiology No results found.  Procedures Procedures (including critical care time)  Medications Ordered in UC Medications  ipratropium-albuterol (DUONEB) 0.5-2.5 (3) MG/3ML nebulizer solution 3 mL (3 mLs Nebulization Given 02/19/17 1104)     Initial Impression / Assessment and Plan / UC Course  I have reviewed the triage vital signs and the nursing notes.  Pertinent labs & imaging results that were available during my care of the patient were reviewed by me and considered in my medical decision making (see chart for details).    patient was given DuoNeb treatment unfortunately he states or performed the nursing staff didn't help much. This point time recommend he follow-up with his PCP and go to emergency room if he feels worse. Prescription Tussionex 1 teaspoon twice a day given to patient Zithromax 500 mg 1 tablet daily for 5 days  albuterol inhaler to use for bronchospasm and he was placed on 6 day course of prednisone as well. Work note given for today and tomorrow also.   Final Clinical Impressions(s) / UC Diagnoses   Final diagnoses:  Acute bronchitis with bronchospasm    New Prescriptions Discharge Medication List as of 02/19/2017 11:13 AM    START taking these medications   Details  albuterol (PROVENTIL HFA;VENTOLIN HFA) 108 (90 Base) MCG/ACT inhaler Inhale 2 puffs into the lungs every 4 (four) hours as needed for wheezing or shortness of breath., Starting Tue 02/19/2017, Normal    azithromycin (ZITHROMAX) 500  MG tablet Take 1 tablet (500 mg total) by mouth daily., Starting Tue 02/19/2017, Normal    chlorpheniramine-HYDROcodone (TUSSIONEX PENNKINETIC ER) 10-8 MG/5ML SUER Take 5 mLs by mouth every 12 (twelve) hours as needed., Starting Tue 02/19/2017, Normal    predniSONE (STERAPRED UNI-PAK 21 TAB) 10 MG (21) TBPK tablet Sig 6 tablet day 1, 5 tablets day 2, 4 tablets day 3,,3tablets day 4, 2 tablets day 5, 1 tablet day 6 take all tablets orally, Normal        Note: This dictation was prepared with Dragon dictation along with smaller phrase technology. Any transcriptional errors that result from this process are unintentional.   Hassan RowanEugene Willoughby Doell, MD 02/19/17 1128

## 2017-02-19 NOTE — ED Triage Notes (Addendum)
Pt c/o chest congestion, heavy chest lots of mucus for about a week. He feels really tired. He gets short of breath with talking and walking.

## 2018-05-12 ENCOUNTER — Other Ambulatory Visit: Payer: Self-pay

## 2018-05-12 ENCOUNTER — Encounter: Payer: Self-pay | Admitting: Emergency Medicine

## 2018-05-12 ENCOUNTER — Ambulatory Visit
Admission: EM | Admit: 2018-05-12 | Discharge: 2018-05-12 | Disposition: A | Discharge status: Home / Self Care | Payer: BLUE CROSS/BLUE SHIELD | Attending: Emergency Medicine | Admitting: Emergency Medicine

## 2018-05-12 DIAGNOSIS — J069 Acute upper respiratory infection, unspecified: Secondary | ICD-10-CM

## 2018-05-12 MED ORDER — HYDROCOD POLST-CPM POLST ER 10-8 MG/5ML PO SUER: 5.0000 mL | Freq: Two times a day (BID) | ORAL | 0 refills | Status: DC

## 2018-05-12 MED ORDER — BENZONATATE 200 MG PO CAPS: ORAL_CAPSULE | ORAL | 0 refills | Status: DC

## 2018-05-12 MED ORDER — FLUTICASONE PROPIONATE 50 MCG/ACT NA SUSP: 2.0000 | Freq: Every day | NASAL | 0 refills | Status: DC

## 2018-05-12 MED ORDER — ALBUTEROL SULFATE HFA 108 (90 BASE) MCG/ACT IN AERS: 1.0000 | INHALATION_SPRAY | Freq: Four times a day (QID) | RESPIRATORY_TRACT | 0 refills | Status: DC | PRN

## 2018-05-12 NOTE — Discharge Instructions (Addendum)
Use Afrin for 2 days longer.  At the same time use Flonase on a daily basis and continue using it for up to 2 to 4 weeks.  Use Tessalon Perles during the daytime and Tussionex at nighttime.  For shortness of breath use the albuterol inhaler.

## 2018-05-12 NOTE — ED Provider Notes (Signed)
[facility]    CSN: [id number] Arrival date & time: 05/12/18  0803     History   Chief Complaint Chief Complaint  Patient presents with  . Otalgia  . Sinus Problem  . Nasal Congestion  . Cough    HPI Grant Thompson is a 47 y.o. male.   HPI  47 year old male presents with cough chest congestion nasal congestion bilateral ear pressure and sinus congestion that started 2 days ago on Saturday.  He said no fever or chills.  He has been using Afrin nasal spray but despite this has been having continued sinus pressure and pain.  Having difficulty sleeping at night because of the coughing and congestion that he has been experiencing.  Had bronchitis and "pneumonia" in the past.  He is a non-smoker       Past Medical History:  Diagnosis Date  . Kidney calculi     There are no active problems to display for this patient.   Past Surgical History:  Procedure Laterality Date  . EXTRACORPOREAL SHOCK WAVE LITHOTRIPSY Left 10/13/2015   Procedure: EXTRACORPOREAL SHOCK WAVE LITHOTRIPSY (ESWL);  Surgeon: Orson Ape, MD;  Location: [facility] ORS;  Service: Urology;  Laterality: Left;       Home Medications    Prior to Admission medications   Medication Sig Start Date End Date Taking? Authorizing Provider  albuterol (PROVENTIL HFA;VENTOLIN HFA) 108 (90 Base) MCG/ACT inhaler Inhale 1-2 puffs into the lungs every 6 (six) hours as needed for wheezing or shortness of breath. Use with spacer 05/12/18   Lutricia Feil, PA-C  benzonatate (TESSALON) 200 MG capsule Take one cap TID PRN cough 05/12/18   Lutricia Feil, PA-C  chlorpheniramine-HYDROcodone [facility] ER) 10-8 MG/5ML SUER Take 5 mLs by mouth 2 (two) times daily. 05/12/18   Lutricia Feil, PA-C  fluticasone (FLONASE) 50 MCG/ACT nasal spray Place 2 sprays into both nostrils daily. 05/12/18   Lutricia Feil, PA-C    Family History Family History  Problem Relation Age of Onset  . Diabetes  Mother   . Hypertension Mother   . Hypertension Father   . Diabetes Father     Social History Social History   Tobacco Use  . Smoking status: Never Smoker  . Smokeless tobacco: Never Used  Substance Use Topics  . Alcohol use: No  . Drug use: No     Allergies   Patient has no known allergies.   Review of Systems Review of Systems  Constitutional: Positive for activity change. Negative for appetite change, chills, fatigue and fever.  HENT: Positive for congestion, ear pain, postnasal drip, rhinorrhea, sinus pressure, sinus pain and sore throat.   Respiratory: Positive for cough and shortness of breath.   All other systems reviewed and are negative.    Physical Exam Triage Vital Signs ED Triage Vitals  Enc Vitals Group     BP 05/12/18 0821 (!) 146/99     Pulse Rate 05/12/18 0821 72     Resp 05/12/18 0821 16     Temp 05/12/18 0821 97.7 F (36.5 C)     Temp Source 05/12/18 0821 Oral     SpO2 05/12/18 0821 100 %     Weight 05/12/18 0817 230 lb (104.3 kg)     Height --      Head Circumference --      Peak Flow --      Pain Score 05/12/18 0817 7     Pain Loc --  Pain Edu? --      Excl. in GC? --    No data found.  Updated Vital Signs BP (!) 146/99 (BP Location: Left Arm)   Pulse 72   Temp 97.7 F (36.5 C) (Oral)   Resp 16   Wt 230 lb (104.3 kg)   SpO2 100%   BMI 4491.88 kg/m   Visual Acuity Right Eye Distance:   Left Eye Distance:   Bilateral Distance:    Right Eye Near:   Left Eye Near:    Bilateral Near:     Physical Exam  Constitutional: He is oriented to person, place, and time. He appears well-developed and well-nourished. No distress.  HENT:  Head: Normocephalic.  Right Ear: External ear normal.  Left Ear: External ear normal.  Nose: Nose normal.  Mouth/Throat: Oropharynx is clear and moist. No oropharyngeal exudate.  Eyes: Pupils are equal, round, and reactive to light. Right eye exhibits no discharge. Left eye exhibits no discharge.   Neck: Normal range of motion.  Pulmonary/Chest: Effort normal and breath sounds normal.  Musculoskeletal: Normal range of motion.  Lymphadenopathy:    He has cervical adenopathy.  Neurological: He is alert and oriented to person, place, and time.  Skin: Skin is warm and dry. He is not diaphoretic.  Psychiatric: He has a normal mood and affect. His behavior is normal. Judgment and thought content normal.  Nursing note and vitals reviewed.    UC Treatments / Results  Labs (all labs ordered are listed, but only abnormal results are displayed) Labs Reviewed - No data to display  EKG None  Radiology No results found.  Procedures Procedures (including critical care time)  Medications Ordered in UC Medications - No data to display  Initial Impression / Assessment and Plan / UC Course  I have reviewed the triage vital signs and the nursing notes.  Pertinent labs & imaging results that were available during my care of the patient were reviewed by me and considered in my medical decision making (see chart for details).     Plan: 1. Test/x-ray results and diagnosis reviewed with patient 2. rx as per orders; risks, benefits, potential side effects reviewed with patient 3. Recommend supportive treatment with cough suppressants and albuterol as necessary.  Have explained to him this is likely a virus which unfortunately must run its course but would not likely respond to antibiotics.  If he continues to have symptoms 10 to 14 days out he should be reevaluated. 4. F/u prn if symptoms worsen or don't improve  Final Clinical Impressions(s) / UC Diagnoses   Final diagnoses:  Upper respiratory tract infection, unspecified type     Discharge Instructions     Use Afrin for 2 days longer.  At the same time use Flonase on a daily basis and continue using it for up to 2 to 4 weeks.  Use Tessalon Perles during the daytime and Tussionex at nighttime.  For shortness of breath use the  albuterol inhaler.   ED Prescriptions    Medication Sig Dispense Auth. Provider   fluticasone (FLONASE) 50 MCG/ACT nasal spray Place 2 sprays into both nostrils daily. 16 g Ovid Curdoemer, William P, PA-C   benzonatate (TESSALON) 200 MG capsule Take one cap TID PRN cough 30 capsule Ovid Curdoemer, William P, PA-C   albuterol (PROVENTIL HFA;VENTOLIN HFA) 108 (90 Base) MCG/ACT inhaler Inhale 1-2 puffs into the lungs every 6 (six) hours as needed for wheezing or shortness of breath. Use with spacer 1 Inhaler Lutricia Feiloemer, William P,  PA-C   chlorpheniramine-HYDROcodone (TUSSIONEX PENNKINETIC ER) 10-8 MG/5ML SUER Take 5 mLs by mouth 2 (two) times daily. 115 mL Lutricia Feil, PA-C     Controlled Substance Prescriptions [place] Controlled Substance Registry consulted? Not Applicable   Lutricia Feil, PA-C 05/12/18 9604

## 2018-05-12 NOTE — ED Triage Notes (Signed)
Patient c/o cough, chest congestion, nasal congestion, bilateral ear pressure and sinus congestion that started on Saturday.  Patient denies fevers.

## 2018-05-16 ENCOUNTER — Encounter: Payer: Self-pay | Admitting: Emergency Medicine

## 2018-05-16 ENCOUNTER — Ambulatory Visit
Admission: EM | Admit: 2018-05-16 | Discharge: 2018-05-16 | Disposition: A | Discharge status: Home / Self Care | Payer: BLUE CROSS/BLUE SHIELD | Attending: Emergency Medicine | Admitting: Emergency Medicine

## 2018-05-16 ENCOUNTER — Other Ambulatory Visit: Payer: Self-pay

## 2018-05-16 DIAGNOSIS — R059 Cough, unspecified: Secondary | ICD-10-CM

## 2018-05-16 DIAGNOSIS — R05 Cough: Secondary | ICD-10-CM

## 2018-05-16 DIAGNOSIS — J320 Chronic maxillary sinusitis: Secondary | ICD-10-CM | POA: Diagnosis not present

## 2018-05-16 MED ORDER — AMOXICILLIN-POT CLAVULANATE 875-125 MG PO TABS: 1.0000 | ORAL_TABLET | Freq: Two times a day (BID) | ORAL | 0 refills | Status: AC

## 2018-05-16 MED ORDER — HYDROCOD POLST-CPM POLST ER 10-8 MG/5ML PO SUER: 5.0000 mL | Freq: Two times a day (BID) | ORAL | 0 refills | Status: DC | PRN

## 2018-05-16 NOTE — Discharge Instructions (Signed)
Recommend start Augmentin 875mg  twice a day as directed. Continue Tessalon cough pills every 8 hours as needed. Continue Flonase twice a day. Use Tussionex 1 teaspoon every 12 hours as needed for cough. Continue Albuterol inhaler 2 puffs every 6 hours as needed for cough or wheezing. Increase fluid intake to help loosen up mucus. Follow-up here in 3 to 4 days if not improving.

## 2018-05-16 NOTE — ED Provider Notes (Signed)
[facility]    CSN: [id number] Arrival date & time: 05/16/18  1126     History   Chief Complaint Chief Complaint  Patient presents with  . Cough    HPI Grant Thompson is a 47 y.o. male.   47 year old male presents with nasal congestion, sore throat, cough and chest congestion for over 1 week. Also having nausea, sinus pressure and upper bilateral tooth pain but denies any fever or vomiting. Was seen 5 days ago here and dx with viral illness. Now symptoms have gotten much worse. Has been using Flonase, Afrin, Tessalon, Tussionex and Proventil with minimal relief. No other chronic health issues. Takes no daily medication.   The history is provided by the patient.    Past Medical History:  Diagnosis Date  . Kidney calculi     There are no active problems to display for this patient.   Past Surgical History:  Procedure Laterality Date  . EXTRACORPOREAL SHOCK WAVE LITHOTRIPSY Left 10/13/2015   Procedure: EXTRACORPOREAL SHOCK WAVE LITHOTRIPSY (ESWL);  Surgeon: Orson Ape, MD;  Location: [facility] ORS;  Service: Urology;  Laterality: Left;       Home Medications    Prior to Admission medications   Medication Sig Start Date End Date Taking? Authorizing Provider  albuterol (PROVENTIL HFA;VENTOLIN HFA) 108 (90 Base) MCG/ACT inhaler Inhale 1-2 puffs into the lungs every 6 (six) hours as needed for wheezing or shortness of breath. Use with spacer 05/12/18  Yes Lutricia Feil, PA-C  benzonatate (TESSALON) 200 MG capsule Take one cap TID PRN cough 05/12/18  Yes Ovid Curd P, PA-C  fluticasone (FLONASE) 50 MCG/ACT nasal spray Place 2 sprays into both nostrils daily. 05/12/18  Yes Lutricia Feil, PA-C  amoxicillin-clavulanate (AUGMENTIN) 875-125 MG tablet Take 1 tablet by mouth every 12 (twelve) hours for 7 days. 05/16/18 05/23/18  Sudie Grumbling, NP  chlorpheniramine-HYDROcodone (TUSSIONEX PENNKINETIC ER) 10-8 MG/5ML SUER Take 5 mLs by mouth every 12  (twelve) hours as needed for cough. 05/16/18   Sudie Grumbling, NP    Family History Family History  Problem Relation Age of Onset  . Diabetes Mother   . Hypertension Mother   . Hypertension Father   . Diabetes Father     Social History Social History   Tobacco Use  . Smoking status: Never Smoker  . Smokeless tobacco: Never Used  Substance Use Topics  . Alcohol use: No  . Drug use: No     Allergies   Patient has no known allergies.   Review of Systems Review of Systems  Constitutional: Positive for fatigue. Negative for activity change, appetite change, chills and fever.  HENT: Positive for congestion, postnasal drip, rhinorrhea, sinus pressure, sinus pain and sore throat. Negative for ear discharge, ear pain, facial swelling, mouth sores, nosebleeds, sneezing and trouble swallowing.   Eyes: Negative for pain, discharge, redness and itching.  Respiratory: Positive for cough, chest tightness and wheezing. Negative for shortness of breath.   Cardiovascular: Negative for chest pain and palpitations.  Gastrointestinal: Negative for abdominal pain, diarrhea, nausea and vomiting.  Musculoskeletal: Negative for arthralgias, myalgias, neck pain and neck stiffness.  Skin: Negative for rash and wound.  Neurological: Positive for headaches. Negative for dizziness, tremors, seizures, syncope, weakness and light-headedness.  Hematological: Positive for adenopathy. Does not bruise/bleed easily.  Psychiatric/Behavioral: Negative.      Physical Exam Triage Vital Signs ED Triage Vitals  Enc Vitals Group     BP 05/16/18 1139 (!)  134/98     Pulse Rate 05/16/18 1139 90     Resp 05/16/18 1139 16     Temp 05/16/18 1139 98.2 F (36.8 C)     Temp Source 05/16/18 1139 Oral     SpO2 05/16/18 1139 97 %     Weight 05/16/18 1136 230 lb (104.3 kg)     Height 05/16/18 1136 6' (1.829 m)     Head Circumference --      Peak Flow --      Pain Score 05/16/18 1136 7     Pain Loc --      Pain  Edu? --      Excl. in GC? --    No data found.  Updated Vital Signs BP (!) 134/98 (BP Location: Left Arm)   Pulse 90   Temp 98.2 F (36.8 C) (Oral)   Resp 16   Ht 6' (1.829 m)   Wt 230 lb (104.3 kg)   SpO2 97%   BMI 31.19 kg/m   Visual Acuity Right Eye Distance:   Left Eye Distance:   Bilateral Distance:    Right Eye Near:   Left Eye Near:    Bilateral Near:     Physical Exam  Constitutional: He is oriented to person, place, and time. He appears well-developed and well-nourished. He is cooperative. He appears ill. No distress.  HENT:  Head: Normocephalic and atraumatic.  Right Ear: Hearing, tympanic membrane, external ear and ear canal normal.  Left Ear: Hearing, tympanic membrane, external ear and ear canal normal.  Nose: Mucosal edema and rhinorrhea present. Right sinus exhibits no maxillary sinus tenderness and no frontal sinus tenderness. Left sinus exhibits maxillary sinus tenderness. Left sinus exhibits no frontal sinus tenderness.  Mouth/Throat: Uvula is midline and mucous membranes are normal. Oropharyngeal exudate (yellowish post nasal drainage) and posterior oropharyngeal erythema present.  Eyes: Conjunctivae and EOM are normal.  Neck: Normal range of motion. Neck supple.  Cardiovascular: Normal rate, regular rhythm and normal heart sounds.  No murmur heard. Pulmonary/Chest: Effort normal. No respiratory distress. He has no decreased breath sounds. He has wheezes (mild) in the right upper field, the right lower field, the left upper field and the left lower field. He has rhonchi (when coughing) in the right upper field and the left upper field. He has no rales.  Musculoskeletal: Normal range of motion.  Lymphadenopathy:    He has cervical adenopathy.       Right cervical: Superficial cervical and deep cervical adenopathy present.       Left cervical: Superficial cervical and deep cervical adenopathy present.  Neurological: He is alert and oriented to person,  place, and time.  Skin: Skin is warm and dry. Capillary refill takes less than 2 seconds. No rash noted.  Psychiatric: He has a normal mood and affect. His behavior is normal. Judgment and thought content normal.  Vitals reviewed.    UC Treatments / Results  Labs (all labs ordered are listed, but only abnormal results are displayed) Labs Reviewed - No data to display  EKG None  Radiology No results found.  Procedures Procedures (including critical care time)  Medications Ordered in UC Medications - No data to display  Initial Impression / Assessment and Plan / UC Course  I have reviewed the triage vital signs and the nursing notes.  Pertinent labs & imaging results that were available during my care of the patient were reviewed by me and considered in my medical decision making (see chart for  details).    Reviewed with patient that he probably has a sinus infection. Will start on Augmentin as directed. No need for imaging at this time. May continue Tessalon pills and Flonase as directed. Discussed only using Afrin for 3 days total at one time (quit now and may resume again in 2 days if needed).  May continue Tussionex as needed for cough at night. Continue Albuterol inhaler as needed for wheezing. Follow-up here in 3 to 4 days if not improving.  Final Clinical Impressions(s) / UC Diagnoses   Final diagnoses:  Left maxillary sinusitis  Cough     Discharge Instructions     Recommend start Augmentin 875mg  twice a day as directed. Continue Tessalon cough pills every 8 hours as needed. Continue Flonase twice a day. Use Tussionex 1 teaspoon every 12 hours as needed for cough. Continue Albuterol inhaler 2 puffs every 6 hours as needed for cough or wheezing. Increase fluid intake to help loosen up mucus. Follow-up here in 3 to 4 days if not improving.     ED Prescriptions    Medication Sig Dispense Auth. Provider   amoxicillin-clavulanate (AUGMENTIN) 875-125 MG tablet Take 1  tablet by mouth every 12 (twelve) hours for 7 days. 14 tablet Marguis Mathieson, Ali LoweAnn Berry, NP   chlorpheniramine-HYDROcodone (TUSSIONEX PENNKINETIC ER) 10-8 MG/5ML SUER Take 5 mLs by mouth every 12 (twelve) hours as needed for cough. 115 mL Sudie GrumblingAmyot, Kensly Bowmer Berry, NP     Controlled Substance Prescriptions [place] Controlled Substance Registry consulted? Yes, I have consulted the [place] Controlled Substances Registry for this patient, and feel the risk/benefit ratio today is favorable for proceeding with this prescription for a controlled substance. Patient just filled Tussionex on 05/12/18 although not listed in Registry. No other active Rx present. Will refill Tussionex today so patient has enough medication to last another 5 to 7 days as needed.    Sudie GrumblingAmyot, Tupac Jeffus Berry, NP 05/16/18 1559

## 2018-05-16 NOTE — ED Triage Notes (Signed)
Patient c/o cough and chest congestion for over a week.  Patient was seen here on Monday and states that his symptoms have gotten worse.  Patient denies fevers.

## 2018-08-18 ENCOUNTER — Encounter: Payer: Self-pay | Admitting: Emergency Medicine

## 2018-08-18 ENCOUNTER — Ambulatory Visit
Admission: EM | Admit: 2018-08-18 | Discharge: 2018-08-18 | Disposition: A | Discharge status: Home / Self Care | Payer: BLUE CROSS/BLUE SHIELD | Attending: Emergency Medicine | Admitting: Emergency Medicine

## 2018-08-18 ENCOUNTER — Other Ambulatory Visit: Payer: Self-pay

## 2018-08-18 DIAGNOSIS — I10 Essential (primary) hypertension: Secondary | ICD-10-CM

## 2018-08-18 DIAGNOSIS — R3129 Other microscopic hematuria: Secondary | ICD-10-CM

## 2018-08-18 DIAGNOSIS — R609 Edema, unspecified: Secondary | ICD-10-CM

## 2018-08-18 LAB — URINALYSIS, COMPLETE (UACMP) WITH MICROSCOPIC
Bacteria, UA: NONE SEEN
Bilirubin Urine: NEGATIVE
Glucose, UA: NEGATIVE mg/dL
Ketones, ur: NEGATIVE mg/dL
Leukocytes, UA: NEGATIVE
Nitrite: NEGATIVE
Protein, ur: NEGATIVE mg/dL
Specific Gravity, Urine: 1.015 (ref 1.005–1.030)
Squamous Epithelial / LPF: NONE SEEN (ref 0–5)
pH: 7 (ref 5.0–8.0)

## 2018-08-18 LAB — CBC WITH DIFFERENTIAL/PLATELET
Basophils Absolute: 0.1 10*3/uL (ref 0–0.1)
Basophils Relative: 1 %
Eosinophils Absolute: 0.4 10*3/uL (ref 0–0.7)
Eosinophils Relative: 8 %
HCT: 50.2 % (ref 40.0–52.0)
Hemoglobin: 16.9 g/dL (ref 13.0–18.0)
Lymphocytes Relative: 33 %
Lymphs Abs: 1.7 10*3/uL (ref 1.0–3.6)
MCH: 30.3 pg (ref 26.0–34.0)
MCHC: 33.6 g/dL (ref 32.0–36.0)
MCV: 90.1 fL (ref 80.0–100.0)
Monocytes Absolute: 0.5 10*3/uL (ref 0.2–1.0)
Monocytes Relative: 10 %
Neutro Abs: 2.4 10*3/uL (ref 1.4–6.5)
Neutrophils Relative %: 48 %
Platelets: 182 10*3/uL (ref 150–440)
RBC: 5.57 MIL/uL (ref 4.40–5.90)
RDW: 14.5 % (ref 11.5–14.5)
WBC: 5.1 10*3/uL (ref 3.8–10.6)

## 2018-08-18 LAB — COMPREHENSIVE METABOLIC PANEL
ALT: 45 U/L — ABNORMAL HIGH (ref 0–44)
AST: 26 U/L (ref 15–41)
Albumin: 4.1 g/dL (ref 3.5–5.0)
Alkaline Phosphatase: 67 U/L (ref 38–126)
Anion gap: 11 (ref 5–15)
BUN: 16 mg/dL (ref 6–20)
CO2: 28 mmol/L (ref 22–32)
Calcium: 9.5 mg/dL (ref 8.9–10.3)
Chloride: 101 mmol/L (ref 98–111)
Creatinine, Ser: 1.05 mg/dL (ref 0.61–1.24)
GFR calc Af Amer: >60 mL/min (ref >60)
GFR calc non Af Amer: >60 mL/min (ref >60)
Glucose, Bld: 111 mg/dL — ABNORMAL HIGH (ref 70–99)
Potassium: 4.8 mmol/L (ref 3.5–5.1)
Sodium: 140 mmol/L (ref 135–145)
Total Bilirubin: 0.7 mg/dL (ref 0.3–1.2)
Total Protein: 7.8 g/dL (ref 6.5–8.1)

## 2018-08-18 NOTE — Discharge Instructions (Addendum)
Elevate your legs sufficiently to control swelling.  Follow up with a primary care physician for your hypertension and for the hematuria that we discussed.  Use compression hose for periods of prolonged standing at work.

## 2018-08-18 NOTE — ED Triage Notes (Signed)
Patient c/o bilateral feet and hand swelling that started x 1 week ago. Patient stated he was unable to put his shoes on. The swelling has since improved but he stills has the swelling.

## 2018-08-18 NOTE — ED Provider Notes (Signed)
[facility]    CSN: [id number] Arrival date & time: 08/18/18  0954     History   Chief Complaint Chief Complaint  Patient presents with  . Leg Swelling  . hand swelling    HPI Lance SellMichael L Mollett is a 47 y.o. male.   HPI  47 year old male presents with history of bilateral lower extremity and hand swelling.  He states that it started on Wednesday worsened on until he had a peak on Saturday and that has since been slowly normalizing.  Associated numbness with the swelling.  Does describe pitting edema present of his lower extremities during the time when it was most severe.  Not have claudication symptoms.  [place] the onset of his symptoms.  He has had no kidney disease but has had a history of kidney stones.  He currently denies any pain.  He is hypertensive at 140/100.  He has states that he did not eat excessive salt in his diet during the time that the swelling was severe.  Shortness of breath.  Did not have any fever or chills.  Is not noticed any changes in his urine.        Past Medical History:  Diagnosis Date  . Kidney calculi     There are no active problems to display for this patient.   Past Surgical History:  Procedure Laterality Date  . EXTRACORPOREAL SHOCK WAVE LITHOTRIPSY Left 10/13/2015   Procedure: EXTRACORPOREAL SHOCK WAVE LITHOTRIPSY (ESWL);  Surgeon: Orson ApeMichael R Wolff, MD;  Location: [facility] OR[facility];  Service: Urology;  Laterality: Left;       Home Medications    Prior to Admission medications   Not on File    Family History Family History  Problem Relation Age of Onset  . Diabetes Mother   . Hypertension Mother   . Hypertension Father   . Diabetes Father     Social History Social History   Tobacco Use  . Smoking status: Never Smoker  . Smokeless tobacco: Never Used  Substance Use Topics  . Alcohol use: Yes    Comment: socially  . Drug use: No     Allergies   Patient  has no known allergies.   Review of Systems Review of Systems  Constitutional: Positive for activity change. Negative for appetite change, chills, fatigue and fever.  Respiratory: Negative for shortness of breath, wheezing and stridor.   Cardiovascular: Positive for leg swelling.  Genitourinary: Negative for difficulty urinating.  All other systems reviewed and are negative.    Physical Exam Triage Vital Signs ED Triage Vitals  Enc Vitals Group     BP 08/18/18 1019 (!) 139/105     Pulse Rate 08/18/18 1019 80     Resp 08/18/18 1019 18     Temp 08/18/18 1019 98 F (36.7 C)     Temp Source 08/18/18 1019 Oral     SpO2 08/18/18 1019 98 %     Weight 08/18/18 1023 260 lb (117.9 kg)     Height 08/18/18 1023 6\' 2"  (1.88 m)     Head Circumference --      Peak Flow --      Pain Score 08/18/18 1023 5     Pain Loc --      Pain Edu? --      Excl. in GC? --    No data found.  Updated Vital Signs BP (!) 140/100 (BP Location: Left Arm)  Pulse 80   Temp 98 F (36.7 C) (Oral)   Resp 18   Ht 6\' 2"  (1.88 m)   Wt 260 lb (117.9 kg)   SpO2 98%   BMI 33.38 kg/m   Visual Acuity Right Eye Distance:   Left Eye Distance:   Bilateral Distance:    Right Eye Near:   Left Eye Near:    Bilateral Near:     Physical Exam  Constitutional: He is oriented to person, place, and time. He appears well-developed and well-nourished. No distress.  HENT:  Head: Normocephalic.  Eyes: Pupils are equal, round, and reactive to light. Right eye exhibits no discharge. Left eye exhibits no discharge.  Neck: Normal range of motion.  Cardiovascular: Intact distal pulses.  Pulmonary/Chest: Effort normal and breath sounds normal.  Musculoskeletal: Normal range of motion. He exhibits edema and tenderness.  Exam of the lower extremities shows mild swelling areas of the socks have compressed against his skin.  There is no pitting edema present today.  Is no calf tenderness.  There is no warmth or erythema.   There are no bands palpable.  Peripheral pulses are intact with the popliteal dorsalis pedis and posterior tibialis all palpable.  Neurological: He is alert and oriented to person, place, and time.  Skin: Skin is warm and dry. Capillary refill takes less than 2 seconds. He is not diaphoretic.  Psychiatric: He has a normal mood and affect. His behavior is normal. Judgment and thought content normal.  Nursing note and vitals reviewed.    UC Treatments / Results  Labs (all labs ordered are listed, but only abnormal results are displayed) Labs Reviewed  COMPREHENSIVE METABOLIC PANEL - Abnormal; Notable for the following components:      Result Value   Glucose, Bld 111 (*)    ALT 45 (*)    All other components within normal limits  URINALYSIS, COMPLETE (UACMP) WITH MICROSCOPIC - Abnormal; Notable for the following components:   Hgb urine dipstick MODERATE (*)    All other components within normal limits  CBC WITH DIFFERENTIAL/PLATELET    EKG None  Radiology No results found.  Procedures Procedures (including critical care time)  Medications Ordered in UC Medications - No data to display  Initial Impression / Assessment and Plan / UC Course  I have reviewed the triage vital signs and the nursing notes.  Pertinent labs & imaging results that were available during my care of the patient were reviewed by me and considered in my medical decision making (see chart for details).     She does not currently have a primary care physician but needs to find one.  I recommended calling the insurance carrier for names of potential primary care physicians that he may follow-up with.  Told him that he needs to have follow-up for his hypertension as well as his hematuria.  Having no pain today unlikely to be from a kidney stone.  However previous urinalysis did not show any microscopic hematuria.  For his peripheral edema I have recommended the use of compression hose of medium compression  strength during work and may remove them or when he is inactive.  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 Final Clinical Impressions(s) / UC Diagnoses   Final diagnoses:  Peripheral edema  Essential hypertension  Hematuria, microscopic  Discharge Instructions     Elevate your legs sufficiently to control swelling.  Follow up with a primary care physician for your hypertension and for the hematuria that we discussed.  Use compression hose for periods of prolonged standing at work.     ED Prescriptions    None     Controlled Substance Prescriptions [place] Controlled Substance Registry consulted? Not Applicable   Lutricia Feil, PA-C 08/18/18 1314

## 2019-01-12 ENCOUNTER — Ambulatory Visit
Admission: EM | Admit: 2019-01-12 | Discharge: 2019-01-12 | Disposition: A | Discharge status: Home / Self Care | Payer: BLUE CROSS/BLUE SHIELD | Attending: Family Medicine | Admitting: Family Medicine

## 2019-01-12 ENCOUNTER — Other Ambulatory Visit: Payer: Self-pay

## 2019-01-12 ENCOUNTER — Encounter: Payer: Self-pay | Admitting: Emergency Medicine

## 2019-01-12 DIAGNOSIS — J069 Acute upper respiratory infection, unspecified: Secondary | ICD-10-CM | POA: Diagnosis not present

## 2019-01-12 MED ORDER — PREDNISONE 20 MG PO TABS: 20.0000 mg | ORAL_TABLET | Freq: Every day | ORAL | 0 refills | Status: DC

## 2019-01-12 NOTE — ED Triage Notes (Signed)
Patient c/o nasal congestion, eye pain and sinus pain and pressure that started 3 days ago. Denies fever.

## 2019-01-12 NOTE — ED Provider Notes (Signed)
[facility]    CSN: [id number] Arrival date & time: 01/12/19  1350     History   Chief Complaint Chief Complaint  Patient presents with  . Nasal Congestion  . Facial Pain    HPI Grant Thompson is a 48 y.o. male.   The history is provided by the patient.  URI  Presenting symptoms: congestion, cough, fatigue and rhinorrhea   Severity:  Moderate Onset quality:  Sudden Duration:  3 days Timing:  Constant Progression:  Worsening Chronicity:  New Relieved by:  Nothing Ineffective treatments:  OTC medications Associated symptoms: sinus pain   Associated symptoms: no wheezing   Risk factors: sick contacts     Past Medical History:  Diagnosis Date  . Kidney calculi     There are no active problems to display for this patient.   Past Surgical History:  Procedure Laterality Date  . EXTRACORPOREAL SHOCK WAVE LITHOTRIPSY Left 10/13/2015   Procedure: EXTRACORPOREAL SHOCK WAVE LITHOTRIPSY (ESWL);  Surgeon: Orson Ape, MD;  Location: [facility] OR[facility];  Service: Urology;  Laterality: Left;       Home Medications    Prior to Admission medications   Medication Sig Start Date End Date Taking? Authorizing Provider  predniSONE (DELTASONE) 20 MG tablet Take 1 tablet (20 mg total) by mouth daily. 01/12/19   Payton Mccallum, MD    Family History Family History  Problem Relation Age of Onset  . Diabetes Mother   . Hypertension Mother   . Hypertension Father   . Diabetes Father     Social History Social History   Tobacco Use  . Smoking status: Never Smoker  . Smokeless tobacco: Never Used  Substance Use Topics  . Alcohol use: Yes    Comment: socially  . Drug use: No     Allergies   Patient has no known allergies.   Review of Systems Review of Systems  Constitutional: Positive for fatigue.  HENT: Positive for congestion, rhinorrhea and sinus pain.   Respiratory: Positive for cough. Negative for wheezing.      Physical Exam Triage Vital  Signs ED Triage Vitals  Enc Vitals Group     BP 01/12/19 1409 (!) 147/103     Pulse Rate 01/12/19 1409 80     Resp 01/12/19 1409 18     Temp 01/12/19 1409 98 F (36.7 C)     Temp Source 01/12/19 1409 Oral     SpO2 01/12/19 1409 98 %     Weight 01/12/19 1408 250 lb (113.4 kg)     Height 01/12/19 1408 6' (1.829 m)     Head Circumference --      Peak Flow --      Pain Score 01/12/19 1407 8     Pain Loc --      Pain Edu? --      Excl. in GC? --    No data found.  Updated Vital Signs BP (!) 147/103 (BP Location: Right Arm)   Pulse 80   Temp 98 F (36.7 C) (Oral)   Resp 18   Ht 6' (1.829 m)   Wt 113.4 kg   SpO2 98%   BMI 33.91 kg/m   Visual Acuity Right Eye Distance:   Left Eye Distance:   Bilateral Distance:    Right Eye Near:   Left Eye Near:    Bilateral Near:     Physical Exam Vitals signs and nursing note reviewed.  Constitutional:      General: He is  not in acute distress.    Appearance: He is well-developed. He is not toxic-appearing or diaphoretic.  HENT:     Head: Normocephalic and atraumatic.     Right Ear: Tympanic membrane, ear canal and external ear normal.     Left Ear: Tympanic membrane, ear canal and external ear normal.     Nose: Congestion and rhinorrhea present.     Mouth/Throat:     Pharynx: Uvula midline. No oropharyngeal exudate.     Tonsils: No tonsillar abscesses.  Eyes:     General: No scleral icterus.       Right eye: No discharge.        Left eye: No discharge.  Neck:     Musculoskeletal: Normal range of motion and neck supple.     Thyroid: No thyromegaly.     Trachea: No tracheal deviation.  Cardiovascular:     Rate and Rhythm: Normal rate and regular rhythm.     Heart sounds: Normal heart sounds.  Pulmonary:     Effort: Pulmonary effort is normal. No respiratory distress.     Breath sounds: Normal breath sounds. No stridor. No wheezing, rhonchi or rales.  Chest:     Chest wall: No tenderness.  Lymphadenopathy:      Cervical: No cervical adenopathy.  Skin:    General: Skin is warm and dry.     Findings: No rash.  Neurological:     Mental Status: He is alert.      UC Treatments / Results  Labs (all labs ordered are listed, but only abnormal results are displayed) Labs Reviewed - No data to display  EKG None  Radiology No results found.  Procedures Procedures (including critical care time)  Medications Ordered in UC Medications - No data to display  Initial Impression / Assessment and Plan / UC Course  I have reviewed the triage vital signs and the nursing notes.  Pertinent labs & imaging results that were available during my care of the patient were reviewed by me and considered in my medical decision making (see chart for details).     ` Final Clinical Impressions(s) / UC Diagnoses   Final diagnoses:  Viral URI     Discharge Instructions     Flonase steroid nasal spray Sudafed 12 hour (ask pharmacist)    ED Prescriptions    Medication Sig Dispense Auth. Provider   predniSONE (DELTASONE) 20 MG tablet Take 1 tablet (20 mg total) by mouth daily. 5 tablet Payton Mccallum, MD      1. diagnosis reviewed with patient 2. rx as per orders above; reviewed possible side effects, interactions, risks and benefits  3. Recommend supportive treatment as above 4. Follow-up prn if symptoms worsen or don't improve   Controlled Substance Prescriptions [place] Controlled Substance Registry consulted? Not Applicable   Payton Mccallum, MD 01/12/19 1455

## 2019-01-12 NOTE — Discharge Instructions (Addendum)
Flonase steroid nasal spray Sudafed 12 hour (ask pharmacist)

## 2022-02-14 ENCOUNTER — Ambulatory Visit
Admission: EM | Admit: 2022-02-14 | Discharge: 2022-02-14 | Disposition: A | Discharge status: Home / Self Care | Payer: BLUE CROSS/BLUE SHIELD | Attending: Emergency Medicine | Admitting: Emergency Medicine

## 2022-02-14 ENCOUNTER — Other Ambulatory Visit: Payer: Self-pay

## 2022-02-14 ENCOUNTER — Encounter: Payer: Self-pay | Admitting: Emergency Medicine

## 2022-02-14 DIAGNOSIS — J0110 Acute frontal sinusitis, unspecified: Secondary | ICD-10-CM

## 2022-02-14 MED ORDER — AMOXICILLIN-POT CLAVULANATE 875-125 MG PO TABS: 1.0000 | ORAL_TABLET | Freq: Two times a day (BID) | ORAL | 0 refills | Status: AC

## 2022-02-14 NOTE — ED Triage Notes (Signed)
Pt c/o nasal congestion, sinus pain/pressure, runny nose, itchy/watery eyes, post nasal drip, and cough. Started about 4-5 days ago. Denies fever.  ?

## 2022-02-14 NOTE — Discharge Instructions (Signed)
The Augmentin twice daily with food for 10 days for treatment of your sinusitis. ? ?Perform sinus irrigation 2-3 times a day with a [employer] sinus rinse kit and distilled water.  Do not use tap water. ? ?You can use plain over-the-counter Mucinex every 6 hours to break up the stickiness of the mucus so your body can clear it. ? ?Increase your oral fluid intake to thin out your mucus so that is also able for your body to clear more easily. ? ?Use the flonase  nasal spray  as needed for nasal and sinus congestion. ? ?If you develop any new or worsening symptoms return for reevaluation or see your primary care provider.   ?

## 2022-02-14 NOTE — ED Provider Notes (Signed)
?[facility] ? ? ? ?CSN: [id number] ?Arrival date & time: 02/14/22  1201 ? ? ?  ? ?History   ?Chief Complaint ?Chief Complaint  ?Patient presents with  ? Nasal Congestion  ? ? ?HPI ?Grant Thompson is a 51 y.o. male.  ? ?Patient presents with nasal congestion, rhinorrhea, postnasal drip, sinus pain and pressure primarily in the frontal region and a nonproductive cough for 5 days.  Associated ear popping bilaterally. Endorses symptoms are worsening.  tolerating food and liquids.  No known sick contacts.  Has attempted use of Mucinex and Sudafed with no improvement.  ?Past Medical History:  ?Diagnosis Date  ? Kidney calculi   ? ? ?There are no problems to display for this patient. ? ? ?Past Surgical History:  ?Procedure Laterality Date  ? EXTRACORPOREAL SHOCK WAVE LITHOTRIPSY Left 10/13/2015  ? Procedure: EXTRACORPOREAL SHOCK WAVE LITHOTRIPSY (ESWL);  Surgeon: Orson Ape, MD;  Location: [facility];  Service: Urology;  Laterality: Left;  ? ? ? ? ? ?Home Medications   ? ?Prior to Admission medications   ?Medication Sig Start Date End Date Taking? Authorizing Provider  ?predniSONE (DELTASONE) 20 MG tablet Take 1 tablet (20 mg total) by mouth daily. 01/12/19   Payton Mccallum, MD  ? ? ?Family History ?Family History  ?Problem Relation Age of Onset  ? Diabetes Mother   ? Hypertension Mother   ? Hypertension Father   ? Diabetes Father   ? ? ?Social History ?Social History  ? ?Tobacco Use  ? Smoking status: Never  ? Smokeless tobacco: Never  ?Vaping Use  ? Vaping Use: Never used  ?Substance Use Topics  ? Alcohol use: Yes  ?  Comment: socially  ? Drug use: No  ? ? ? ?Allergies   ?Patient has no known allergies. ? ? ?Review of Systems ?Review of Systems  ?Constitutional: Negative.   ?HENT:  Positive for congestion, postnasal drip, rhinorrhea, sinus pressure and sinus pain. Negative for dental problem, drooling, ear discharge, ear pain, facial swelling, hearing loss, mouth sores, nosebleeds, sneezing, sore  throat, tinnitus, trouble swallowing and voice change.   ?Respiratory:  Positive for cough. Negative for apnea, choking, chest tightness, shortness of breath, wheezing and stridor.   ?Cardiovascular: Negative.   ?Skin: Negative.   ?Neurological: Negative.   ? ? ?Physical Exam ?Triage Vital Signs ?ED Triage Vitals  ?Enc Vitals Group  ?   BP 02/14/22 1216 (!) 147/99  ?   Pulse Rate 02/14/22 1216 81  ?   Resp 02/14/22 1216 18  ?   Temp 02/14/22 1216 98.7 ?F (37.1 ?C)  ?   Temp Source 02/14/22 1216 Oral  ?   SpO2 02/14/22 1216 98 %  ?   Weight 02/14/22 1215 250 lb (113.4 kg)  ?   Height 02/14/22 1215 6' (1.829 m)  ?   Head Circumference --   ?   Peak Flow --   ?   Pain Score 02/14/22 1214 8  ?   Pain Loc --   ?   Pain Edu? --   ?   Excl. in GC? --   ? ?No data found. ? ?Updated Vital Signs ?BP (!) 147/99 (BP Location: Right Arm)   Pulse 81   Temp 98.7 ?F (37.1 ?C) (Oral)   Resp 18   Ht 6' (1.829 m)   Wt 250 lb (113.4 kg)   SpO2 98%   BMI 33.91 kg/m?  ? ?Visual Acuity ?Right Eye Distance:   ?Left Eye Distance:   ?  Bilateral Distance:   ? ?Right Eye Near:   ?Left Eye Near:    ?Bilateral Near:    ? ?Physical Exam ?Constitutional:   ?   Appearance: Normal appearance.  ?HENT:  ?   Head: Normocephalic.  ?   Right Ear: Hearing, ear canal and external ear normal. A middle ear effusion is present.  ?   Left Ear: Hearing, ear canal and external ear normal. A middle ear effusion is present.  ?   Nose: Congestion and rhinorrhea present.  ?   Right Sinus: Maxillary sinus tenderness and frontal sinus tenderness present.  ?   Left Sinus: Maxillary sinus tenderness and frontal sinus tenderness present.  ?   Mouth/Throat:  ?   Mouth: Mucous membranes are moist.  ?   Pharynx: Oropharynx is clear.  ?Eyes:  ?   Extraocular Movements: Extraocular movements intact.  ?Cardiovascular:  ?   Rate and Rhythm: Normal rate and regular rhythm.  ?   Pulses: Normal pulses.  ?   Heart sounds: Normal heart sounds.  ?Pulmonary:  ?   Effort:  Pulmonary effort is normal.  ?   Breath sounds: Normal breath sounds.  ?Musculoskeletal:  ?   Cervical back: Normal range of motion and neck supple.  ?Skin: ?   General: Skin is warm and dry.  ?Neurological:  ?   Mental Status: He is alert and oriented to person, place, and time. Mental status is at baseline.  ?Psychiatric:     ?   Mood and Affect: Mood normal.     ?   Behavior: Behavior normal.  ? ? ? ?UC Treatments / Results  ?Labs ?(all labs ordered are listed, but only abnormal results are displayed) ?Labs Reviewed - No data to display ? ?EKG ? ? ?Radiology ?No results found. ? ?Procedures ?Procedures (including critical care time) ? ?Medications Ordered in UC ?Medications - No data to display ? ?Initial Impression / Assessment and Plan / UC Course  ?I have reviewed the triage vital signs and the nursing notes. ? ?Pertinent labs & imaging results that were available during my care of the patient were reviewed by me and considered in my medical decision making (see chart for details). ? ?Acute nonrecurrent frontal sinusitis ? ?Vital signs are stable and patient is in no signs of distress, he is visibly uncomfortable, we will move forward with treatment for bacterial coverage, Augmentin 10-day course prescribed, recommended to continue use of over-the-counter medications for additional supportive care, may follow-up with urgent care or PCP for further evaluation and management ?Final Clinical Impressions(s) / UC Diagnoses  ? ?Final diagnoses:  ?None  ? ?Discharge Instructions   ?None ?  ? ?ED Prescriptions   ?None ?  ? ?PDMP not reviewed this encounter. ?  ?Valinda Hoar, NP ?02/14/22 1259 ? ?

## 2023-01-23 ENCOUNTER — Ambulatory Visit (INDEPENDENT_AMBULATORY_CARE_PROVIDER_SITE_OTHER): Payer: Self-pay

## 2023-01-23 ENCOUNTER — Ambulatory Visit
Admission: EM | Admit: 2023-01-23 | Discharge: 2023-01-23 | Disposition: A | Discharge status: Home / Self Care | Payer: Self-pay | Attending: Emergency Medicine | Admitting: Emergency Medicine

## 2023-01-23 DIAGNOSIS — R051 Acute cough: Secondary | ICD-10-CM

## 2023-01-23 DIAGNOSIS — J0100 Acute maxillary sinusitis, unspecified: Secondary | ICD-10-CM

## 2023-01-23 MED ORDER — PROMETHAZINE-DM 6.25-15 MG/5ML PO SYRP: 5.0000 mL | ORAL_SOLUTION | Freq: Four times a day (QID) | ORAL | 0 refills | Status: AC | PRN

## 2023-01-23 MED ORDER — IPRATROPIUM BROMIDE 0.06 % NA SOLN: 2.0000 | Freq: Four times a day (QID) | NASAL | 12 refills | Status: AC

## 2023-01-23 MED ORDER — BENZONATATE 100 MG PO CAPS: 200.0000 mg | ORAL_CAPSULE | Freq: Three times a day (TID) | ORAL | 0 refills | Status: AC

## 2023-01-23 MED ORDER — AMOXICILLIN-POT CLAVULANATE 875-125 MG PO TABS: 1.0000 | ORAL_TABLET | Freq: Two times a day (BID) | ORAL | 0 refills | Status: AC

## 2023-01-23 MED ORDER — ALBUTEROL SULFATE HFA 108 (90 BASE) MCG/ACT IN AERS: 2.0000 | INHALATION_SPRAY | RESPIRATORY_TRACT | 0 refills | Status: AC | PRN

## 2023-01-23 MED ORDER — AEROCHAMBER MV MISC: 2 refills | Status: AC

## 2023-01-23 NOTE — ED Provider Notes (Signed)
[facility]    CSN: [id number] Arrival date & time: 01/23/23  0813      History   Chief Complaint Chief Complaint  Patient presents with   Sore Throat   Congestion    HPI Grant Thompson is a 52 y.o. male.   HPI  52 year old male here for evaluation of respiratory complaints.  The patient reports that his symptoms have been going on for last 7 days and they consist of nasal congestion with bloody green nasal discharge, sinus pressure, and pressure in his upper teeth.  He also endorses a sore throat and a cough that is productive for the same bloody green sputum.  He is complaining of pressure in the middle of his chest along with shortness of breath and wheezing.  He states he feels like he is drowning.  His only significant past medical history is renal calculi and lithotripsy.  Past Medical History:  Diagnosis Date   Kidney calculi     There are no problems to display for this patient.   Past Surgical History:  Procedure Laterality Date   EXTRACORPOREAL SHOCK WAVE LITHOTRIPSY Left 10/13/2015   Procedure: EXTRACORPOREAL SHOCK WAVE LITHOTRIPSY (ESWL);  Surgeon: Royston Cowper, MD;  Location: [facility] ORS;  Service: Urology;  Laterality: Left;       Home Medications    Prior to Admission medications   Medication Sig Start Date End Date Taking? Authorizing Provider  albuterol (VENTOLIN HFA) 108 (90 Base) MCG/ACT inhaler Inhale 2 puffs into the lungs every 4 (four) hours as needed. 01/23/23  Yes Margarette Canada, NP  amoxicillin-clavulanate (AUGMENTIN) 875-125 MG tablet Take 1 tablet by mouth every 12 (twelve) hours for 10 days. 01/23/23 02/02/23 Yes Margarette Canada, NP  benzonatate (TESSALON) 100 MG capsule Take 2 capsules (200 mg total) by mouth every 8 (eight) hours. 01/23/23  Yes Margarette Canada, NP  ipratropium (ATROVENT) 0.06 % nasal spray Place 2 sprays into both nostrils 4 (four) times daily. 01/23/23  Yes Margarette Canada, NP  promethazine-dextromethorphan  (PROMETHAZINE-DM) 6.25-15 MG/5ML syrup Take 5 mLs by mouth 4 (four) times daily as needed. 01/23/23  Yes Margarette Canada, NP  Spacer/Aero-Holding Josiah Lobo (AEROCHAMBER MV) inhaler Use as instructed 01/23/23  Yes Margarette Canada, NP    Family History Family History  Problem Relation Age of Onset   Diabetes Mother    Hypertension Mother    Hypertension Father    Diabetes Father     Social History Social History   Tobacco Use   Smoking status: Never   Smokeless tobacco: Never  Vaping Use   Vaping Use: Never used  Substance Use Topics   Alcohol use: Yes    Comment: socially   Drug use: No     Allergies   Patient has no known allergies.   Review of Systems Review of Systems  Constitutional:  Negative for fever.  HENT:  Positive for congestion, ear pain, rhinorrhea, sinus pressure, sinus pain and sore throat.        Ears pop when he blows his nose.  Respiratory:  Positive for cough, shortness of breath and wheezing.   Cardiovascular:  Positive for chest pain.       Central chest pressure.     Physical Exam Triage Vital Signs ED Triage Vitals  Enc Vitals Group     BP 01/23/23 0839 (!) 158/94     Pulse Rate 01/23/23 0839 80     Resp 01/23/23 0839 16     Temp 01/23/23 0839 98.2 F (  36.8 C)     Temp Source 01/23/23 0839 Oral     SpO2 01/23/23 0839 98 %     Weight 01/23/23 0839 300 lb (136.1 kg)     Height 01/23/23 0839 6' (1.829 m)     Head Circumference --      Peak Flow --      Pain Score 01/23/23 0838 8     Pain Loc --      Pain Edu? --      Excl. in [place]? --    No data found.  Updated Vital Signs BP (!) 158/94 (BP Location: Left Arm)   Pulse 80   Temp 98.2 F (36.8 C) (Oral)   Resp 16   Ht 6' (1.829 m)   Wt 300 lb (136.1 kg)   SpO2 98%   BMI 40.69 kg/m   Visual Acuity Right Eye Distance:   Left Eye Distance:   Bilateral Distance:    Right Eye Near:   Left Eye Near:    Bilateral Near:     Physical Exam Vitals and nursing note reviewed.   Constitutional:      Appearance: Normal appearance. He is obese. He is not ill-appearing.  HENT:     Head: Normocephalic and atraumatic.     Right Ear: Tympanic membrane, ear canal and external ear normal. There is no impacted cerumen.     Left Ear: Tympanic membrane, ear canal and external ear normal. There is no impacted cerumen.     Nose: Congestion and rhinorrhea present.     Comments: Bilateral nasal passages are occluded by green and bloody nasal discharge.  Patient has tenderness to compression of bilateral maxillary sinuses.    Mouth/Throat:     Mouth: Mucous membranes are moist.     Pharynx: Oropharynx is clear. No oropharyngeal exudate or posterior oropharyngeal erythema.  Neck:     Comments: Patient is tenderness to palpation of his anterior cervical region but no appreciable lymphadenopathy. Cardiovascular:     Rate and Rhythm: Normal rate and regular rhythm.     Pulses: Normal pulses.     Heart sounds: Normal heart sounds. No murmur heard.    No friction rub. No gallop.  Pulmonary:     Effort: Pulmonary effort is normal.     Breath sounds: No wheezing, rhonchi or rales.     Comments: Lung sounds decreased in bases bilaterally. Musculoskeletal:     Cervical back: Normal range of motion and neck supple. Tenderness present.  Lymphadenopathy:     Cervical: No cervical adenopathy.  Skin:    General: Skin is warm and dry.  Neurological:     Mental Status: He is alert.      UC Treatments / Results  Labs (all labs ordered are listed, but only abnormal results are displayed) Labs Reviewed - No data to display  EKG Normal sinus rhythm with a ventricular rate of 79 bpm PR interval 160 ms QRS duration 100 ms QT/QTc 390/447 ms Minimal voltage criteria for LVH.  There is mild flattening of the ST segment in III and V1 but no other T wave or ST abnormalities appreciated.  Radiology DG Chest 2 View  Result Date: 01/23/2023 CLINICAL DATA:  Cough and shortness of breath.   Wheezing for 1 week. EXAM: CHEST - 2 VIEW COMPARISON:  Chest radiographs 03/14/2010 FINDINGS: Cardiac silhouette and mediastinal contours are within normal limits. The lungs are clear. No pleural effusion or pneumothorax. No acute skeletal abnormality. IMPRESSION: No active cardiopulmonary disease.  Electronically Signed   By: Yvonne Kendall M.D.   On: 01/23/2023 09:34    Procedures Procedures (including critical care time)  Medications Ordered in UC Medications - No data to display  Initial Impression / Assessment and Plan / UC Course  I have reviewed the triage vital signs and the nursing notes.  Pertinent labs & imaging results that were available during my care of the patient were reviewed by me and considered in my medical decision making (see chart for details).   Patient is a nontoxic 52 year old male with a BMI of 40.69 presenting for evaluation of 1 weeks worth of upper and lower respiratory symptoms.  Right now he states that he feels like he is drowning and he is complaining of central chest pressure.  He is not diaphoretic and there is no radiation of the pain.  He states that it started today.  I will obtain EKG to rule out any potential cardiac cause.  I am also can order a chest x-ray because he has decreased lung sounds in bilateral bases.  There is no significant past medical history is renal calculi that had to be resolved with lithotripsy.  He is able to speak in full sentence without dyspnea or tachypnea.  His vital signs show mildly elevated blood pressure 158/94 but are otherwise unremarkable.  Patient's room air oxygen saturation is 98%.  EKG shows normal sinus rhythm and it is reading minimal voltage criteria for LVH as well as ST abnormality this reading is possible digitalis effects.  She is not on digitalis.  I reviewed his EKG from 03/14/2010 and there is no change.  The flattening of the ST segment in lead III and V1 were present at that time.  There were no other tracings  available for comparison in epic.  Chest x-ray shows no active cardiopulmonary disease per radiology read.  I will discharge patient home with a diagnosis of URI and cough and I will prescribe him an albuterol inhaler that he can use for shortness of breath and wheezing.  Given that he is blowing out bloody discharge from both naris and having sinus tenderness I will treat him for maxillary sinusitis with a 10-day course of Augmentin.  Also Atrovent nasal spray help with the congestion along with Tessalon Perles and Promethazine DM cough syrup to help with cough and congestion.  Return precautions reviewed.   Final Clinical Impressions(s) / UC Diagnoses   Final diagnoses:  Acute non-recurrent maxillary sinusitis  Acute cough     Discharge Instructions      Your chest x-ray did not show any signs of pneumonia and your EKG did not show any signs of cardiac abnormality.  I am going to treat you for maxillary sinusitis as well as your cough.  Use the albuterol inhaler, 1 to 2 puffs every 4-6 hours with a spacer, as needed for shortness of breath, wheezing, and chest tightness.  The Augmentin twice daily with food for 10 days for treatment of your sinusitis.  Perform sinus irrigation 2-3 times a day with a [employer] sinus rinse kit and distilled water.  Do not use tap water.  You can use plain over-the-counter Mucinex every 6 hours to break up the stickiness of the mucus so your body can clear it.  Increase your oral fluid intake to thin out your mucus so that is also able for your body to clear more easily.  Take an over-the-counter probiotic, such as Culturelle-align-activia, 1 hour after each dose of antibiotic to  prevent diarrhea.  Use the Atrovent nasal spray every 6 hours as needed for nasal congestion.  You can instill 2 squirts in each nostril.  Use the Tessalon Perles every 8 hours during the day as needed for cough.  Take them with a small sip of water.  They may give you  numbness to the base of your tongue or metallic taste in her mouth, this is normal.  Use the Promethazine DM cough syrup at bedtime as needed for cough and congestion.  Be mindful of this medication will make you drowsy.  If you develop any new or worsening symptoms return for reevaluation or see your primary care provider.      ED Prescriptions     Medication Sig Dispense Auth. Provider   amoxicillin-clavulanate (AUGMENTIN) 875-125 MG tablet Take 1 tablet by mouth every 12 (twelve) hours for 10 days. 20 tablet Margarette Canada, NP   albuterol (VENTOLIN HFA) 108 (90 Base) MCG/ACT inhaler Inhale 2 puffs into the lungs every 4 (four) hours as needed. 18 g Margarette Canada, NP   Spacer/Aero-Holding Chambers (AEROCHAMBER MV) inhaler Use as instructed 1 each Margarette Canada, NP   benzonatate (TESSALON) 100 MG capsule Take 2 capsules (200 mg total) by mouth every 8 (eight) hours. 21 capsule Margarette Canada, NP   ipratropium (ATROVENT) 0.06 % nasal spray Place 2 sprays into both nostrils 4 (four) times daily. 15 mL Margarette Canada, NP   promethazine-dextromethorphan (PROMETHAZINE-DM) 6.25-15 MG/5ML syrup Take 5 mLs by mouth 4 (four) times daily as needed. 118 mL Margarette Canada, NP      PDMP not reviewed this encounter.   Margarette Canada, NP 01/23/23 [phone]

## 2023-01-23 NOTE — ED Triage Notes (Signed)
Pt c/o sore throat,cough & congestion x7 days.

## 2023-01-23 NOTE — Discharge Instructions (Addendum)
Your chest x-ray did not show any signs of pneumonia and your EKG did not show any signs of cardiac abnormality.  I am going to treat you for maxillary sinusitis as well as your cough.  Use the albuterol inhaler, 1 to 2 puffs every 4-6 hours with a spacer, as needed for shortness of breath, wheezing, and chest tightness.  The Augmentin twice daily with food for 10 days for treatment of your sinusitis.  Perform sinus irrigation 2-3 times a day with a [employer]Med sinus rinse kit and distilled water.  Do not use tap water.  You can use plain over-the-counter Mucinex every 6 hours to break up the stickiness of the mucus so your body can clear it.  Increase your oral fluid intake to thin out your mucus so that is also able for your body to clear more easily.  Take an over-the-counter probiotic, such as Culturelle-align-activia, 1 hour after each dose of antibiotic to prevent diarrhea.  Use the Atrovent nasal spray every 6 hours as needed for nasal congestion.  You can instill 2 squirts in each nostril.  Use the Tessalon Perles every 8 hours during the day as needed for cough.  Take them with a small sip of water.  They may give you numbness to the base of your tongue or metallic taste in her mouth, this is normal.  Use the Promethazine DM cough syrup at bedtime as needed for cough and congestion.  Be mindful of this medication will make you drowsy.  If you develop any new or worsening symptoms return for reevaluation or see your primary care provider.
